# Patient Record
Sex: Female | Born: 1955 | Race: White | Hispanic: No | Marital: Married | Smoking: Never smoker
Health system: Southern US, Community
[De-identification: ages and names within clinical notes are randomized; demographics above are authoritative.]

## PROBLEM LIST (undated history)

## (undated) DIAGNOSIS — R112 Nausea with vomiting, unspecified: Secondary | ICD-10-CM

## (undated) DIAGNOSIS — I4892 Unspecified atrial flutter: Secondary | ICD-10-CM

## (undated) DIAGNOSIS — M1990 Unspecified osteoarthritis, unspecified site: Secondary | ICD-10-CM

## (undated) DIAGNOSIS — I499 Cardiac arrhythmia, unspecified: Secondary | ICD-10-CM

## (undated) DIAGNOSIS — F329 Major depressive disorder, single episode, unspecified: Secondary | ICD-10-CM

## (undated) DIAGNOSIS — D649 Anemia, unspecified: Secondary | ICD-10-CM

## (undated) DIAGNOSIS — K219 Gastro-esophageal reflux disease without esophagitis: Secondary | ICD-10-CM

## (undated) DIAGNOSIS — E119 Type 2 diabetes mellitus without complications: Secondary | ICD-10-CM

## (undated) DIAGNOSIS — F419 Anxiety disorder, unspecified: Secondary | ICD-10-CM

## (undated) DIAGNOSIS — E11610 Type 2 diabetes mellitus with diabetic neuropathic arthropathy: Secondary | ICD-10-CM

## (undated) DIAGNOSIS — Z9889 Other specified postprocedural states: Secondary | ICD-10-CM

## (undated) DIAGNOSIS — F32A Depression, unspecified: Secondary | ICD-10-CM

## (undated) DIAGNOSIS — I10 Essential (primary) hypertension: Secondary | ICD-10-CM

## (undated) DIAGNOSIS — R197 Diarrhea, unspecified: Secondary | ICD-10-CM

## (undated) DIAGNOSIS — G629 Polyneuropathy, unspecified: Secondary | ICD-10-CM

## (undated) HISTORY — PX: HAND SURGERY: SHX662

## (undated) HISTORY — DX: Unspecified atrial flutter: I48.92

## (undated) HISTORY — PX: FOOT SURGERY: SHX648

## (undated) HISTORY — PX: APPENDECTOMY: SHX54

## (undated) HISTORY — PX: HEMORRHOID SURGERY: SHX153

---

## 1998-02-06 ENCOUNTER — Inpatient Hospital Stay (HOSPITAL_COMMUNITY): Admission: EM | Admit: 1998-02-06 | Discharge: 1998-02-09 | Payer: Self-pay | Admitting: Emergency Medicine

## 1998-02-07 ENCOUNTER — Encounter: Payer: Self-pay | Admitting: Specialist

## 2010-11-25 DIAGNOSIS — I32 Pericarditis in diseases classified elsewhere: Secondary | ICD-10-CM

## 2012-11-01 ENCOUNTER — Encounter (HOSPITAL_COMMUNITY): Payer: Self-pay | Admitting: Emergency Medicine

## 2012-11-01 ENCOUNTER — Inpatient Hospital Stay (HOSPITAL_COMMUNITY): Payer: 59

## 2012-11-01 ENCOUNTER — Emergency Department (HOSPITAL_COMMUNITY): Payer: 59

## 2012-11-01 ENCOUNTER — Inpatient Hospital Stay (HOSPITAL_COMMUNITY)
Admission: EM | Admit: 2012-11-01 | Discharge: 2012-11-06 | DRG: 308 | Disposition: A | Discharge status: Home / Self Care | Payer: 59 | Attending: Internal Medicine | Admitting: Internal Medicine

## 2012-11-01 DIAGNOSIS — Z79899 Other long term (current) drug therapy: Secondary | ICD-10-CM

## 2012-11-01 DIAGNOSIS — E1149 Type 2 diabetes mellitus with other diabetic neurological complication: Secondary | ICD-10-CM | POA: Diagnosis present

## 2012-11-01 DIAGNOSIS — E876 Hypokalemia: Secondary | ICD-10-CM | POA: Diagnosis present

## 2012-11-01 DIAGNOSIS — R112 Nausea with vomiting, unspecified: Secondary | ICD-10-CM | POA: Diagnosis present

## 2012-11-01 DIAGNOSIS — R000 Tachycardia, unspecified: Secondary | ICD-10-CM | POA: Diagnosis present

## 2012-11-01 DIAGNOSIS — I4892 Unspecified atrial flutter: Principal | ICD-10-CM | POA: Diagnosis present

## 2012-11-01 DIAGNOSIS — R5381 Other malaise: Secondary | ICD-10-CM

## 2012-11-01 DIAGNOSIS — Z7901 Long term (current) use of anticoagulants: Secondary | ICD-10-CM

## 2012-11-01 DIAGNOSIS — E872 Acidosis, unspecified: Secondary | ICD-10-CM | POA: Diagnosis present

## 2012-11-01 DIAGNOSIS — E860 Dehydration: Secondary | ICD-10-CM

## 2012-11-01 DIAGNOSIS — N179 Acute kidney failure, unspecified: Secondary | ICD-10-CM

## 2012-11-01 DIAGNOSIS — I10 Essential (primary) hypertension: Secondary | ICD-10-CM | POA: Diagnosis present

## 2012-11-01 DIAGNOSIS — Z91041 Radiographic dye allergy status: Secondary | ICD-10-CM

## 2012-11-01 DIAGNOSIS — G894 Chronic pain syndrome: Secondary | ICD-10-CM | POA: Diagnosis present

## 2012-11-01 DIAGNOSIS — G8929 Other chronic pain: Secondary | ICD-10-CM | POA: Diagnosis present

## 2012-11-01 DIAGNOSIS — F19939 Other psychoactive substance use, unspecified with withdrawal, unspecified: Secondary | ICD-10-CM | POA: Diagnosis present

## 2012-11-01 DIAGNOSIS — X088XXA Exposure to other specified smoke, fire and flames, initial encounter: Secondary | ICD-10-CM | POA: Diagnosis present

## 2012-11-01 DIAGNOSIS — F1120 Opioid dependence, uncomplicated: Secondary | ICD-10-CM | POA: Diagnosis present

## 2012-11-01 DIAGNOSIS — A419 Sepsis, unspecified organism: Secondary | ICD-10-CM | POA: Diagnosis present

## 2012-11-01 DIAGNOSIS — N390 Urinary tract infection, site not specified: Secondary | ICD-10-CM

## 2012-11-01 DIAGNOSIS — M549 Dorsalgia, unspecified: Secondary | ICD-10-CM | POA: Diagnosis present

## 2012-11-01 DIAGNOSIS — K219 Gastro-esophageal reflux disease without esophagitis: Secondary | ICD-10-CM | POA: Diagnosis present

## 2012-11-01 DIAGNOSIS — E875 Hyperkalemia: Secondary | ICD-10-CM | POA: Diagnosis present

## 2012-11-01 DIAGNOSIS — T23029A Burn of unspecified degree of unspecified single finger (nail) except thumb, initial encounter: Secondary | ICD-10-CM | POA: Diagnosis present

## 2012-11-01 DIAGNOSIS — E1140 Type 2 diabetes mellitus with diabetic neuropathy, unspecified: Secondary | ICD-10-CM | POA: Diagnosis present

## 2012-11-01 HISTORY — DX: Cardiac arrhythmia, unspecified: I49.9

## 2012-11-01 HISTORY — DX: Polyneuropathy, unspecified: G62.9

## 2012-11-01 HISTORY — DX: Type 2 diabetes mellitus without complications: E11.9

## 2012-11-01 HISTORY — DX: Type 2 diabetes mellitus with diabetic neuropathic arthropathy: E11.610

## 2012-11-01 HISTORY — DX: Essential (primary) hypertension: I10

## 2012-11-01 HISTORY — DX: Unspecified osteoarthritis, unspecified site: M19.90

## 2012-11-01 HISTORY — DX: Other specified postprocedural states: Z98.890

## 2012-11-01 HISTORY — DX: Nausea with vomiting, unspecified: R11.2

## 2012-11-01 LAB — CBC WITH DIFFERENTIAL/PLATELET
Basophils Absolute: 0 10*3/uL (ref 0.0–0.1)
Basophils Relative: 0 % (ref 0–1)
Eosinophils Absolute: 0 10*3/uL (ref 0.0–0.7)
Eosinophils Relative: 0 % (ref 0–5)
HCT: 41.3 % (ref 36.0–46.0)
Hemoglobin: 13.4 g/dL (ref 12.0–15.0)
Lymphocytes Relative: 28 % (ref 12–46)
Lymphs Abs: 2.8 10*3/uL (ref 0.7–4.0)
MCH: 27.2 pg (ref 26.0–34.0)
MCHC: 32.4 g/dL (ref 30.0–36.0)
MCV: 83.8 fL (ref 78.0–100.0)
Monocytes Absolute: 0.9 10*3/uL (ref 0.1–1.0)
Monocytes Relative: 10 % (ref 3–12)
Neutro Abs: 6.1 10*3/uL (ref 1.7–7.7)
Neutrophils Relative %: 62 % (ref 43–77)
Platelets: 263 10*3/uL (ref 150–400)
RBC: 4.93 MIL/uL (ref 3.87–5.11)
RDW: 15.5 % (ref 11.5–15.5)
WBC: 9.9 10*3/uL (ref 4.0–10.5)

## 2012-11-01 LAB — COMPREHENSIVE METABOLIC PANEL
ALT: 15 U/L (ref 0–35)
AST: 22 U/L (ref 0–37)
Albumin: 4.2 g/dL (ref 3.5–5.2)
Alkaline Phosphatase: 77 U/L (ref 39–117)
BUN: 39 mg/dL — ABNORMAL HIGH (ref 6–23)
CO2: 26 mEq/L (ref 19–32)
Calcium: 8.4 mg/dL (ref 8.4–10.5)
Chloride: 89 mEq/L — ABNORMAL LOW (ref 96–112)
Creatinine, Ser: 4.81 mg/dL — ABNORMAL HIGH (ref 0.50–1.10)
GFR calc Af Amer: 11 mL/min — ABNORMAL LOW (ref >90)
GFR calc non Af Amer: 9 mL/min — ABNORMAL LOW (ref >90)
Glucose, Bld: 197 mg/dL — ABNORMAL HIGH (ref 70–99)
Potassium: 5.8 mEq/L — ABNORMAL HIGH (ref 3.5–5.1)
Sodium: 136 mEq/L (ref 135–145)
Total Bilirubin: 0.4 mg/dL (ref 0.3–1.2)
Total Protein: 7.7 g/dL (ref 6.0–8.3)

## 2012-11-01 LAB — RAPID URINE DRUG SCREEN, HOSP PERFORMED
Amphetamines: NOT DETECTED
Opiates: POSITIVE — AB

## 2012-11-01 LAB — BASIC METABOLIC PANEL
Chloride: 96 mEq/L (ref 96–112)
Creatinine, Ser: 3.42 mg/dL — ABNORMAL HIGH (ref 0.50–1.10)
GFR calc Af Amer: 16 mL/min — ABNORMAL LOW (ref >90)
Sodium: 137 mEq/L (ref 135–145)

## 2012-11-01 LAB — URINALYSIS, ROUTINE W REFLEX MICROSCOPIC
Hgb urine dipstick: NEGATIVE
Nitrite: NEGATIVE
Protein, ur: NEGATIVE mg/dL
Urobilinogen, UA: 0.2 mg/dL (ref 0.0–1.0)

## 2012-11-01 LAB — POCT I-STAT TROPONIN I

## 2012-11-01 LAB — CG4 I-STAT (LACTIC ACID): Lactic Acid, Venous: 4.75 mmol/L — ABNORMAL HIGH (ref 0.5–2.2)

## 2012-11-01 LAB — URINE MICROSCOPIC-ADD ON

## 2012-11-01 LAB — TROPONIN I: Troponin I: <0.3 ng/mL (ref <0.30)

## 2012-11-01 LAB — GLUCOSE, CAPILLARY: Glucose-Capillary: 82 mg/dL (ref 70–99)

## 2012-11-01 LAB — MRSA PCR SCREENING: MRSA by PCR: NEGATIVE

## 2012-11-01 LAB — LIPASE, BLOOD: Lipase: 17 U/L (ref 11–59)

## 2012-11-01 MED ORDER — LOPERAMIDE HCL 2 MG PO CAPS: 4.0000 mg | ORAL_CAPSULE | Freq: Once | ORAL | Status: DC

## 2012-11-01 MED ORDER — SODIUM CHLORIDE 0.9 % IV SOLN
INTRAVENOUS | Status: DC
  Administered 2012-11-01: 18:00:00 via INTRAVENOUS

## 2012-11-01 MED ORDER — HYDROCODONE-ACETAMINOPHEN 5-325 MG PO TABS
1.0000 | ORAL_TABLET | ORAL | Status: DC | PRN
  Administered 2012-11-02: 1 via ORAL
  Administered 2012-11-02: 2 via ORAL
  Administered 2012-11-02 – 2012-11-03 (×2): 1 via ORAL
  Administered 2012-11-03 – 2012-11-06 (×9): 2 via ORAL
  Filled 2012-11-01 (×6): qty 2
  Filled 2012-11-01: qty 1
  Filled 2012-11-01 (×2): qty 2
  Filled 2012-11-01 (×2): qty 1
  Filled 2012-11-01 (×2): qty 2

## 2012-11-01 MED ORDER — ONDANSETRON HCL 4 MG/2ML IJ SOLN: 4.0000 mg | Freq: Four times a day (QID) | INTRAMUSCULAR | Status: DC | PRN

## 2012-11-01 MED ORDER — SODIUM POLYSTYRENE SULFONATE 15 GM/60ML PO SUSP
15.0000 g | Freq: Once | ORAL | Status: AC
  Administered 2012-11-01: 15 g via ORAL
  Filled 2012-11-01: qty 60

## 2012-11-01 MED ORDER — GABAPENTIN 800 MG PO TABS: 400.0000 mg | ORAL_TABLET | Freq: Two times a day (BID) | ORAL | Status: DC

## 2012-11-01 MED ORDER — INSULIN ASPART 100 UNIT/ML ~~LOC~~ SOLN
0.0000 [IU] | Freq: Three times a day (TID) | SUBCUTANEOUS | Status: DC
  Administered 2012-11-02: 2 [IU] via SUBCUTANEOUS
  Administered 2012-11-02: 1 [IU] via SUBCUTANEOUS
  Administered 2012-11-02: 2 [IU] via SUBCUTANEOUS
  Administered 2012-11-03 – 2012-11-04 (×4): 3 [IU] via SUBCUTANEOUS
  Administered 2012-11-04 – 2012-11-06 (×4): 2 [IU] via SUBCUTANEOUS

## 2012-11-01 MED ORDER — NYSTATIN 100000 UNIT/GM EX POWD
1.0000 g | Freq: Three times a day (TID) | CUTANEOUS | Status: DC
  Administered 2012-11-01 – 2012-11-06 (×14): 1 g via TOPICAL
  Filled 2012-11-01 (×3): qty 15

## 2012-11-01 MED ORDER — CYCLOBENZAPRINE HCL 5 MG PO TABS
5.0000 mg | ORAL_TABLET | Freq: Three times a day (TID) | ORAL | Status: DC | PRN
  Filled 2012-11-01: qty 1

## 2012-11-01 MED ORDER — DEXTROSE-NACL 5-0.9 % IV SOLN
INTRAVENOUS | Status: DC
  Administered 2012-11-02: 125 mL/h via INTRAVENOUS

## 2012-11-01 MED ORDER — METOPROLOL TARTRATE 1 MG/ML IV SOLN
2.5000 mg | Freq: Four times a day (QID) | INTRAVENOUS | Status: DC | PRN
  Administered 2012-11-02: 2.5 mg via INTRAVENOUS
  Filled 2012-11-01 (×2): qty 5

## 2012-11-01 MED ORDER — GABAPENTIN 300 MG PO CAPS
600.0000 mg | ORAL_CAPSULE | Freq: Every morning | ORAL | Status: DC
  Administered 2012-11-01 – 2012-11-02 (×2): 600 mg via ORAL
  Filled 2012-11-01 (×3): qty 2

## 2012-11-01 MED ORDER — PANTOPRAZOLE SODIUM 40 MG IV SOLR
40.0000 mg | Freq: Two times a day (BID) | INTRAVENOUS | Status: DC
  Administered 2012-11-01 – 2012-11-02 (×4): 40 mg via INTRAVENOUS
  Filled 2012-11-01 (×6): qty 40

## 2012-11-01 MED ORDER — HEPARIN SODIUM (PORCINE) 5000 UNIT/ML IJ SOLN
5000.0000 [IU] | Freq: Three times a day (TID) | INTRAMUSCULAR | Status: DC
  Administered 2012-11-01 – 2012-11-02 (×2): 5000 [IU] via SUBCUTANEOUS
  Filled 2012-11-01 (×5): qty 1

## 2012-11-01 MED ORDER — SODIUM CHLORIDE 0.9 % IV BOLUS (SEPSIS)
500.0000 mL | Freq: Once | INTRAVENOUS | Status: AC
  Administered 2012-11-01: 500 mL via INTRAVENOUS

## 2012-11-01 MED ORDER — MORPHINE SULFATE 2 MG/ML IJ SOLN: 1.0000 mg | INTRAMUSCULAR | Status: DC | PRN

## 2012-11-01 MED ORDER — SODIUM CHLORIDE 0.9 % IV SOLN
INTRAVENOUS | Status: DC
  Administered 2012-11-01: 16:00:00 via INTRAVENOUS

## 2012-11-01 MED ORDER — DEXTROSE-NACL 5-0.9 % IV SOLN
INTRAVENOUS | Status: AC
  Administered 2012-11-01: 150 mL via INTRAVENOUS

## 2012-11-01 MED ORDER — PIPERACILLIN-TAZOBACTAM 3.375 G IVPB 30 MIN
3.3750 g | Freq: Once | INTRAVENOUS | Status: AC
  Administered 2012-11-01: 3.375 g via INTRAVENOUS
  Filled 2012-11-01: qty 50

## 2012-11-01 MED ORDER — ACETAMINOPHEN 325 MG PO TABS: 650.0000 mg | ORAL_TABLET | Freq: Four times a day (QID) | ORAL | Status: DC | PRN

## 2012-11-01 MED ORDER — ONDANSETRON HCL 4 MG PO TABS: 4.0000 mg | ORAL_TABLET | Freq: Four times a day (QID) | ORAL | Status: DC | PRN

## 2012-11-01 MED ORDER — ACETAMINOPHEN 650 MG RE SUPP: 650.0000 mg | Freq: Four times a day (QID) | RECTAL | Status: DC | PRN

## 2012-11-01 MED ORDER — AMITRIPTYLINE HCL 25 MG PO TABS
25.0000 mg | ORAL_TABLET | Freq: Every day | ORAL | Status: DC
  Administered 2012-11-01 – 2012-11-05 (×5): 25 mg via ORAL
  Filled 2012-11-01 (×7): qty 1

## 2012-11-01 MED ORDER — ONDANSETRON HCL 4 MG/2ML IJ SOLN
4.0000 mg | Freq: Once | INTRAMUSCULAR | Status: AC
  Administered 2012-11-01: 4 mg via INTRAVENOUS
  Filled 2012-11-01: qty 2

## 2012-11-01 MED ORDER — CALCIUM GLUCONATE 10 % IV SOLN: 1.0000 g | Freq: Once | INTRAVENOUS | Status: DC

## 2012-11-01 MED ORDER — SODIUM CHLORIDE 0.9 % IV SOLN
1.0000 g | Freq: Once | INTRAVENOUS | Status: AC
  Administered 2012-11-01: 1 g via INTRAVENOUS
  Filled 2012-11-01: qty 10

## 2012-11-01 MED ORDER — MORPHINE SULFATE 2 MG/ML IJ SOLN
1.0000 mg | INTRAMUSCULAR | Status: DC | PRN
  Administered 2012-11-02: 2 mg via INTRAVENOUS
  Filled 2012-11-01: qty 1

## 2012-11-01 MED ORDER — PIPERACILLIN-TAZOBACTAM IN DEX 2-0.25 GM/50ML IV SOLN
2.2500 g | Freq: Three times a day (TID) | INTRAVENOUS | Status: DC
  Administered 2012-11-01 – 2012-11-02 (×2): 2.25 g via INTRAVENOUS
  Filled 2012-11-01 (×4): qty 50

## 2012-11-01 MED ORDER — SODIUM BICARBONATE 8.4 % IV SOLN
50.0000 meq | Freq: Once | INTRAVENOUS | Status: AC
  Administered 2012-11-01: 50 meq via INTRAVENOUS
  Filled 2012-11-01: qty 50

## 2012-11-01 MED ORDER — SODIUM CHLORIDE 0.9 % IV BOLUS (SEPSIS)
2000.0000 mL | Freq: Once | INTRAVENOUS | Status: AC
  Administered 2012-11-01: 2000 mL via INTRAVENOUS

## 2012-11-01 NOTE — ED Notes (Signed)
Unable to give report at this time, charge reports she had to call a nurse to come in.

## 2012-11-01 NOTE — ED Notes (Signed)
Patient transported to CT 

## 2012-11-01 NOTE — ED Notes (Signed)
Dr Silverio Lay notified of abnormal lactic.

## 2012-11-01 NOTE — ED Notes (Signed)
US in progress

## 2012-11-01 NOTE — Progress Notes (Signed)
ANTIBIOTIC CONSULT NOTE - INITIAL  Pharmacy Consult for Zosyn Indication:   Allergies  Allergen Reactions  . Contrast Media (Iodinated Diagnostic Agents) Anaphylaxis    IVP-DYE (PATIENT STATES THAT SHE DIED ON THE OPERATING ROOM TABLE)  . Erythromycin Other (See Comments)    SEVERE YEAST INFECTION  . Tegretol (Carbamazepine) Other (See Comments)    Increased heart rate    Patient Measurements:   Adjusted Body Weight:   Vital Signs: Temp: 97.9 F (36.6 C) (06/27 1138) Temp src: Oral (06/27 1138) BP: 125/92 mmHg (06/27 1449) Pulse Rate: 146 (06/27 1138) Intake/Output from previous day:   Intake/Output from this shift:    Labs:  Recent Labs  11/01/12 1150  WBC 9.9  HGB 13.4  PLT 263  CREATININE 4.81*   CrCl is unknown because there is no height on file for the current visit. No results found for this basename: VANCOTROUGH, VANCOPEAK, VANCORANDOM, GENTTROUGH, GENTPEAK, GENTRANDOM, TOBRATROUGH, TOBRAPEAK, TOBRARND, AMIKACINPEAK, AMIKACINTROU, AMIKACIN,  in the last 72 hours   Microbiology: No results found for this or any previous visit (from the past 720 hour(s)).  Medical History: Past Medical History  Diagnosis Date  . Diabetes mellitus without complication   . Arthritis   . Neuropathy   . Charcot foot due to diabetes mellitus   . PONV (postoperative nausea and vomiting)     Assessment: 64 yoF admitted for opiate withdrawal and vomiting.  PMHx significant for DM, charcot foot, and arthritis.  Pt found to be in ARF, with elevated lactate and potassium.  WBC 9.9.  Afebrile.  Pt received one dose of zosyn, and now pharmacy consulted to dose.  Blood, urine cultures collected.  SCr 4.81, no height or weight available.  Normalized CrCl =14 ml/min.   Plan:  1.  Zosyn 2.25gm IV q 8 hours 2.  F/u renal function, cultures, Tm, WBC, and adjust zosyn when CrCl > 20 ml/min.    Haynes Hoehn, PharmD 11/01/2012 3:52 PM  Pager: [phone]

## 2012-11-01 NOTE — H&P (Signed)
[facility] History and Physical  Rebecca Brennan UJW:[id number] DOB: February 11, 1956 DOA: 11/01/2012  Referring physician: Dr Silverio Lay.  PCP: No primary provider on file.  Specialists: None  Chief Complaint: Nausea, vomiting. Wanted to be detox.   HPI: Rebecca Brennan is a 57 y.o. female with PMH significant for opioid dependent, Diabetes, charcot foot who presents to ED complaining of nausea and vomiting since Wednesday. She has not been able to keep her medications down since that time. [facility] wanted to be detox from opioid, but they decline her and refer her to ED. She denies abdominal pain, chest pain, dyspnea, dysuria. She had some fever 3 days prior to admission. She saw  her PCP 3 days ago because of nausea, she received a dose of phenergan. She is having diarrhea since she took the oral contrast here in the ed. She wanted to be detox from opioid because she is tired to be sleepy, and not functioning.   Review of Systems: negative except as per HPI.   Past Medical History  Diagnosis Date  . Diabetes mellitus without complication   . Arthritis   . Neuropathy   . Charcot foot due to diabetes mellitus   . PONV (postoperative nausea and vomiting)    Past Surgical History  Procedure Laterality Date  . Cesarean section    . Appendectomy    . Foot surgery     Social History:  reports that she has never smoked. She has never used smokeless tobacco. She reports that she does not drink alcohol or use illicit drugs.live at home with husband,has one son. She is retired. She was a Lawyer.    Allergies  Allergen Reactions  . Contrast Media (Iodinated Diagnostic Agents) Anaphylaxis    IVP-DYE (PATIENT STATES THAT SHE DIED ON THE OPERATING ROOM TABLE)  . Erythromycin Other (See Comments)    SEVERE YEAST INFECTION  . Tegretol (Carbamazepine) Other (See Comments)    Increased heart rate    Family History  Problem Relation Age of Onset   . Arthritis Mother   . Diabetes Father     Prior to Admission medications   Medication Sig Start Date End Date Taking? Authorizing Provider  amitriptyline (ELAVIL) 25 MG tablet Take 25 mg by mouth at bedtime.   Yes Historical Provider, MD  cyclobenzaprine (FLEXERIL) 10 MG tablet Take 10 mg by mouth 3 (three) times daily as needed for muscle spasms.   Yes Historical Provider, MD  estradiol (ESTRING) 2 MG vaginal ring Place 2 mg vaginally every 3 (three) months. follow package directions   Yes Historical Provider, MD  gabapentin (NEURONTIN) 800 MG tablet Take 800 mg by mouth 5 (five) times daily.   Yes Historical Provider, MD  HYDROcodone-acetaminophen (NORCO) 7.5-325 MG per tablet Take 2 tablets by mouth every 6 (six) hours as needed for pain.   Yes Historical Provider, MD  lisinopril (PRINIVIL,ZESTRIL) 10 MG tablet Take 10 mg by mouth daily.   Yes Historical Provider, MD  metFORMIN (GLUCOPHAGE) 500 MG tablet Take 500 mg by mouth 5 (five) times daily.   Yes Historical Provider, MD  metFORMIN (GLUCOPHAGE-XR) 500 MG 24 hr tablet Take 2,500 mg by mouth daily with breakfast.   Yes Historical Provider, MD  morphine (MS CONTIN) 60 MG 12 hr tablet Take 60 mg by mouth 2 (two) times daily.   Yes Historical Provider, MD  nystatin (MYCOSTATIN/NYSTOP) 100000 UNIT/GM POWD Apply 1 g topically 3 (three) times daily.  Yes Historical Provider, MD  omeprazole (PRILOSEC) 20 MG capsule Take 20 mg by mouth every morning.   Yes Historical Provider, MD  ondansetron (ZOFRAN-ODT) 8 MG disintegrating tablet Take 8 mg by mouth every 8 (eight) hours as needed for nausea.   Yes Historical Provider, MD   Physical Exam: Filed Vitals:   11/01/12 1134 11/01/12 1138 11/01/12 1449  BP: 90/60  125/92  Pulse: 146 146   Temp: 97.9 F (36.6 C) 97.9 F (36.6 C)   TempSrc: Oral Oral   Resp: 20  15  SpO2: 100%  100%   General Appearance:    Alert, cooperative, no distress, appears stated age  Head:    Normocephalic, without  obvious abnormality, atraumatic  Eyes:    PERRL, conjunctiva/corneas clear, EOM's intact,     Ears:    Normal TM's and external ear canals, both ears  Nose:   Nares normal, septum midline, mucosa normal, no drainage    or sinus tenderness  Throat:   Lips, mucosa, and tongue normal; teeth and gums normal  Neck:   Supple, symmetrical, trachea midline, no adenopathy;    thyroid:  no enlargement/tenderness/nodules; no carotid   bruit or JVD  Back:     Symmetric, no curvature, ROM normal, no CVA tenderness  Lungs:     Clear to auscultation bilaterally, respirations unlabored  Chest Wall:    No tenderness or deformity   Heart:    Regular rate and rhythm, S1 and S2 normal, no murmur, rub   or gallop     Abdomen:     Soft, non-tender, bowel sounds active all four quadrants,    no masses, no organomegaly        Extremities:   Extremities normal, atraumatic, no cyanosis or edema  Pulses:   2+ and symmetric all extremities  Skin:   Skin color, texture, turgor normal, no rashes or lesions  Lymph nodes:   Cervical, supraclavicular, and axillary nodes normal  Neurologic:   CNII-XII intact, normal strength, sensation and reflexes    throughout      Labs on Admission:  Basic Metabolic Panel:  Recent Labs Lab 11/01/12 1150  NA 136  K 5.8*  CL 89*  CO2 26  GLUCOSE 197*  BUN 39*  CREATININE 4.81*  CALCIUM 8.4   Liver Function Tests:  Recent Labs Lab 11/01/12 1150  AST 22  ALT 15  ALKPHOS 77  BILITOT 0.4  PROT 7.7  ALBUMIN 4.2    Recent Labs Lab 11/01/12 1150  LIPASE 17   No results found for this basename: AMMONIA,  in the last 168 hours CBC:  Recent Labs Lab 11/01/12 1150  WBC 9.9  NEUTROABS 6.1  HGB 13.4  HCT 41.3  MCV 83.8  PLT 263   Cardiac Enzymes: No results found for this basename: CKTOTAL, CKMB, CKMBINDEX, TROPONINI,  in the last 168 hours  BNP (last 3 results) No results found for this basename: PROBNP,  in the last 8760 hours CBG: No results  found for this basename: GLUCAP,  in the last 168 hours  Radiological Exams on Admission: Ct Abdomen Pelvis Wo Contrast  11/01/2012   *RADIOLOGY REPORT*  Clinical Data: Abdominal pain.  Nausea and vomiting.  Surgical history includes appendectomy, cesarean section, and pessary. Renal insufficiency and prior anaphylactic reaction to iodinated contrast precluded IV contrast administration.  CT ABDOMEN AND PELVIS WITHOUT CONTRAST  Technique:  Multidetector CT imaging of the abdomen and pelvis was performed following the standard protocol without intravenous contrast.  Oral contrast was administered.  Comparison: Report of CT abdomen and pelvis 11/25/2010 from [facility], describing pericardial fluid but no acute findings in the abdomen or pelvis.  The images themselves are not currently available for direct comparison.  Findings: Calcified granulomata in the liver which is otherwise unremarkable for the unenhanced technique.  Normal unenhanced appearance of the spleen and adrenal glands.  Lobular contour to both kidneys consistent with fetal lobulations.  Very small (2 mm) calculus in a lower pole calix of the left kidney without obstruction.  No urinary tract calculi elsewhere on either side. Severe diffuse pancreatic atrophy.  Focal wall thickening involving the independent portion of the gallbladder fundus.  No calcified gallstones.  No biliary ductal dilation.  Mild to moderate aorto- iliofemoral atherosclerosis.  No significant lymphadenopathy.  Oral contrast material within the distal esophagus.  No evidence of hiatal hernia.  Stomach normal in appearance.  Normal-appearing small bowel.  Apparent wall thickening involving the descending colon, sigmoid colon, and rectum is felt to be due to the fact that these portions of the colon are decompressed, as there is no pericolonic inflammation.  Appendix surgically absent.  No ascites.  Pessary device in the pelvis.  Urinary bladder decompressed.  Uterus mildly atrophic consistent with age.  Ovaries normal by CT. No free pelvic fluid.  Numerous pelvic phleboliths.  Bone window images demonstrate severe degenerative changes involving the lower thoracic and lumbar spine.  Visualized lung bases clear.  Heart size normal.  No evidence of pericardial effusion, though there may be mild pericardial thickening.  IMPRESSION:  1.  No acute abnormalities involving the abdomen or pelvis. 2.  Severe pancreatic atrophy. 3.  Focal thickening involving the wall of the gallbladder fundus. Non-emergent right upper quadrant abdominal ultrasound is suggested to exclude a gallbladder tumor. 4.  Non-obstructing 2 mm left lower pole renal calculus.  No urinary tract calculi elsewhere on either side. 5.  Oral contrast material in the distal esophagus without evidence of hiatal hernia.  Query GE reflux and/or esophageal dysmotility. 6.  Mild pericardial thickening without evidence of pericardial effusion.   Original Report Authenticated By: Hulan Saas, M.D.   Dg Chest Portable 1 View  11/01/2012   *RADIOLOGY REPORT*  Clinical Data: Diabetes, nonsmoker  PORTABLE CHEST - 1 VIEW  Comparison: Portable exam 1222 hours without priors for comparison  Findings: Upper normal heart size. Mediastinal contours and pulmonary vascularity normal. Minimal bronchitic changes with atelectasis or scarring lingula. Lungs otherwise clear. No pleural effusion or pneumothorax. Bones unremarkable.  IMPRESSION: Minimal bronchitic changes with lingular atelectasis versus scarring. No additional abnormalities.   Original Report Authenticated By: Ulyses Southward, M.D.    EKG: Independently reviewed. Diffuse st segment elevation.   Assessment/Plan Active Problems:   Acute renal failure   Lactic acidosis   Tachycardia   Nausea & vomiting  1-Acute Renal failure; Probably pre renal, in setting dehydration, ACE, probably UTI. Continue with IV fluids. Will order urine na and cr. Abdominal US to  evaluate for renal obstruction.   2-Lactic acidosis/Sepsis: This could be secondary to UTI, but also in setting of renal failure and metformin intake. IV fluids, stop metformin, blood culture. CCM will monitor patient. BP improved after IV fluids. Zosyn pre pharmacy to cover for UTI and intraabdominal process.  3-Chronic pain syndrome/ Drug withdrawal: Her symptoms of nausea and vomiting started prior to stopping taking medication. She probably has opioids in her system due to decrease renal clearance. Will continue with morphine PRN, monitor for  drug withdrawl. I will adjust gabapentin for renal function.   4-Nausea/vomiting: This could be secondary to infection, maybe component of withdrawal. CT abdomen show thickening of the wall of the gallbladder fundus. Will order abdominal US. IV protonix. Patient denies abdominal pain.  5-EKG abnormalities: Diffuse ST elevation, subsequently EKG A flutter. In setting of electrolytes abnormalities, Hypotension. Cycle cardiac enzymes, Repeat EKG in am, ECHO. PRN lopressor but careful with hypotension. Patient denies chest pain.  6-Hyperkalemia: Received Kayexalate, and calcium gluconate. Repeat B-met later tonight.  7-UTI: UA with small leukocytes. Zosyn should cover. Follow urine culture.  8-Focal thickening involving the wall of the gallbladder fundus. Abdominal US ordered.  9-Diabetes: discontinue metformin. SSI     Code Status: presume full code.  Family Communication: Care discussed with husband and son who were at bedside.  Disposition Plan: expect 3 to 4 days inpatient.   Time spent: 75 minutes.   REGALADO,BELKYS [facility] Pager [phone]  If 7PM-7AM, please contact night-coverage [email or web address] Password [facility] 11/01/2012, 3:49 PM

## 2012-11-01 NOTE — ED Provider Notes (Signed)
History    CSN: [id number] Arrival date & time 11/01/12  1124  First MD Initiated Contact with Patient 11/01/12 1133     Chief Complaint  Patient presents with  . Detox    (Consider location/radiation/quality/duration/timing/severity/associated sxs/prior Treatment) The history is provided by the patient.  Rebecca Brennan is a 57 y.o. female hx of DM with peripheral neuropathy, charcot foot, arthritis here with opiate withdrawal and vomiting. She had fever 4 days ago and saw her doctor 3 days ago. She had normal blood work and was given zofran for nausea. However, she had persistent vomiting and threw up all her medications. She has difficulty tolerating water. She has history of appendectomy and C section but no history of SBO in the past. Denies fevers now. She also wants detox from opiates.   Past Medical History  Diagnosis Date  . Diabetes mellitus without complication   . Arthritis   . Neuropathy   . Charcot foot due to diabetes mellitus    Past Surgical History  Procedure Laterality Date  . Cesarean section    . Appendectomy    . Foot surgery     History reviewed. No pertinent family history. History  Substance Use Topics  . Smoking status: Never Smoker   . Smokeless tobacco: Not on file  . Alcohol Use: No   OB History   Grav Para Term Preterm Abortions TAB SAB Ect Mult Living                 Review of Systems  Constitutional: Positive for fever and chills.  Gastrointestinal: Positive for vomiting and abdominal pain.  Neurological: Positive for weakness.  All other systems reviewed and are negative.    Allergies  Contrast media; Erythromycin; and Tegretol  Home Medications   Current Outpatient Rx  Name  Route  Sig  Dispense  Refill  . amitriptyline (ELAVIL) 25 MG tablet   Oral   Take 25 mg by mouth at bedtime.         . cyclobenzaprine (FLEXERIL) 10 MG tablet   Oral   Take 10 mg by mouth 3 (three) times daily as needed for muscle spasms.          Marland Kitchen estradiol (ESTRING) 2 MG vaginal ring   Vaginal   Place 2 mg vaginally every 3 (three) months. follow package directions         . gabapentin (NEURONTIN) 800 MG tablet   Oral   Take 800 mg by mouth 5 (five) times daily.         Marland Kitchen HYDROcodone-acetaminophen (NORCO) 7.5-325 MG per tablet   Oral   Take 2 tablets by mouth every 6 (six) hours as needed for pain.         Marland Kitchen lisinopril (PRINIVIL,ZESTRIL) 10 MG tablet   Oral   Take 10 mg by mouth daily.         . metFORMIN (GLUCOPHAGE) 500 MG tablet   Oral   Take 500 mg by mouth 5 (five) times daily.         . metFORMIN (GLUCOPHAGE-XR) 500 MG 24 hr tablet   Oral   Take 2,500 mg by mouth daily with breakfast.         . morphine (MS CONTIN) 60 MG 12 hr tablet   Oral   Take 60 mg by mouth 2 (two) times daily.         Marland Kitchen nystatin (MYCOSTATIN/NYSTOP) 100000 UNIT/GM POWD   Topical   Apply 1 g  topically 3 (three) times daily.         Marland Kitchen omeprazole (PRILOSEC) 20 MG capsule   Oral   Take 20 mg by mouth every morning.         . ondansetron (ZOFRAN-ODT) 8 MG disintegrating tablet   Oral   Take 8 mg by mouth every 8 (eight) hours as needed for nausea.          BP 90/60  Pulse 146  Temp(Src) 97.9 F (36.6 C) (Oral)  Resp 20  SpO2 100% Physical Exam  Nursing note and vitals reviewed. Constitutional: She is oriented to person, place, and time.  Uncomfortable, dehydrated.   HENT:  Head: Normocephalic.  MM dry   Eyes: Conjunctivae are normal. Pupils are equal, round, and reactive to light.  Neck: Normal range of motion. Neck supple.  Cardiovascular: Regular rhythm and normal heart sounds.   Tachycardic   Pulmonary/Chest: Effort normal and breath sounds normal. No respiratory distress. She has no wheezes. She has no rales.  Abdominal: Soft. Bowel sounds are normal.  Obese, minimal epigastric tenderness   Musculoskeletal: Normal range of motion.  Neurological: She is alert and oriented to person, place, and  time.  Skin: Skin is warm and dry.  Psychiatric: She has a normal mood and affect. Her behavior is normal. Judgment and thought content normal.    ED Course  Procedures (including critical care time) CRITICAL CARE Performed by: Silverio Lay, DAVID   Total critical care time: 30 min   Critical care time was exclusive of separately billable procedures and treating other patients.  Critical care was necessary to treat or prevent imminent or life-threatening deterioration.  Critical care was time spent personally by me on the following activities: development of treatment plan with patient and/or surrogate as well as nursing, discussions with consultants, evaluation of patient's response to treatment, examination of patient, obtaining history from patient or surrogate, ordering and performing treatments and interventions, ordering and review of laboratory studies, ordering and review of radiographic studies, pulse oximetry and re-evaluation of patient's condition.   Labs Reviewed  COMPREHENSIVE METABOLIC PANEL - Abnormal; Notable for the following:    Potassium 5.8 (*)    Chloride 89 (*)    Glucose, Bld 197 (*)    BUN 39 (*)    Creatinine, Ser 4.81 (*)    GFR calc non Af Amer 9 (*)    GFR calc Af Amer 11 (*)    All other components within normal limits  URINALYSIS, ROUTINE W REFLEX MICROSCOPIC - Abnormal; Notable for the following:    APPearance CLOUDY (*)    Bilirubin Urine MODERATE (*)    Leukocytes, UA SMALL (*)    All other components within normal limits  URINE RAPID DRUG SCREEN (HOSP PERFORMED) - Abnormal; Notable for the following:    Opiates POSITIVE (*)    All other components within normal limits  URINE MICROSCOPIC-ADD ON - Abnormal; Notable for the following:    Squamous Epithelial / LPF MANY (*)    Bacteria, UA FEW (*)    All other components within normal limits  CG4 I-STAT (LACTIC ACID) - Abnormal; Notable for the following:    Lactic Acid, Venous 4.75 (*)    All other  components within normal limits  URINE CULTURE  CBC WITH DIFFERENTIAL  LIPASE, BLOOD  POCT I-STAT TROPONIN I   Ct Abdomen Pelvis Wo Contrast  11/01/2012   *RADIOLOGY REPORT*  Clinical Data: Abdominal pain.  Nausea and vomiting.  Surgical history includes appendectomy,  cesarean section, and pessary. Renal insufficiency and prior anaphylactic reaction to iodinated contrast precluded IV contrast administration.  CT ABDOMEN AND PELVIS WITHOUT CONTRAST  Technique:  Multidetector CT imaging of the abdomen and pelvis was performed following the standard protocol without intravenous contrast.  Oral contrast was administered.  Comparison: Report of CT abdomen and pelvis 11/25/2010 from [facility], describing pericardial fluid but no acute findings in the abdomen or pelvis.  The images themselves are not currently available for direct comparison.  Findings: Calcified granulomata in the liver which is otherwise unremarkable for the unenhanced technique.  Normal unenhanced appearance of the spleen and adrenal glands.  Lobular contour to both kidneys consistent with fetal lobulations.  Very small (2 mm) calculus in a lower pole calix of the left kidney without obstruction.  No urinary tract calculi elsewhere on either side. Severe diffuse pancreatic atrophy.  Focal wall thickening involving the independent portion of the gallbladder fundus.  No calcified gallstones.  No biliary ductal dilation.  Mild to moderate aorto- iliofemoral atherosclerosis.  No significant lymphadenopathy.  Oral contrast material within the distal esophagus.  No evidence of hiatal hernia.  Stomach normal in appearance.  Normal-appearing small bowel.  Apparent wall thickening involving the descending colon, sigmoid colon, and rectum is felt to be due to the fact that these portions of the colon are decompressed, as there is no pericolonic inflammation.  Appendix surgically absent.  No ascites.  Pessary device in the pelvis.  Urinary  bladder decompressed. Uterus mildly atrophic consistent with age.  Ovaries normal by CT. No free pelvic fluid.  Numerous pelvic phleboliths.  Bone window images demonstrate severe degenerative changes involving the lower thoracic and lumbar spine.  Visualized lung bases clear.  Heart size normal.  No evidence of pericardial effusion, though there may be mild pericardial thickening.  IMPRESSION:  1.  No acute abnormalities involving the abdomen or pelvis. 2.  Severe pancreatic atrophy. 3.  Focal thickening involving the wall of the gallbladder fundus. Non-emergent right upper quadrant abdominal ultrasound is suggested to exclude a gallbladder tumor. 4.  Non-obstructing 2 mm left lower pole renal calculus.  No urinary tract calculi elsewhere on either side. 5.  Oral contrast material in the distal esophagus without evidence of hiatal hernia.  Query GE reflux and/or esophageal dysmotility. 6.  Mild pericardial thickening without evidence of pericardial effusion.   Original Report Authenticated By: Hulan Saas, M.D.   Dg Chest Portable 1 View  11/01/2012   *RADIOLOGY REPORT*  Clinical Data: Diabetes, nonsmoker  PORTABLE CHEST - 1 VIEW  Comparison: Portable exam 1222 hours without priors for comparison  Findings: Upper normal heart size. Mediastinal contours and pulmonary vascularity normal. Minimal bronchitic changes with atelectasis or scarring lingula. Lungs otherwise clear. No pleural effusion or pneumothorax. Bones unremarkable.  IMPRESSION: Minimal bronchitic changes with lingular atelectasis versus scarring. No additional abnormalities.   Original Report Authenticated By: Ulyses Southward, M.D.   No diagnosis found.   Date: 11/01/2012  Rate: 144  Rhythm: sinus tachycardia  QRS Axis: normal  Intervals: normal  ST/T Wave abnormalities: nonspecific ST changes  Conduction Disutrbances:none  Narrative Interpretation:   Old EKG Reviewed: none available    MDM  Rebecca Brennan is a 57 y.o. female here with  vomiting and dehydration. She is tachycardic and hypotensive. Will do sepsis workup but likely she is dehydrated. Will check labs and ct ab/pel to r/o SBO. Will hydrate and reassess.   2:47 PM Labs showed Cr. 4.8, K 5.8. CT  showed no SBO. I think she likely has acute renal failure from DM or dehydration causing hyperkalemia. Hyperkalemia is treated in the ED. BP improved with 2 L bolus. Still tachycardic. Will admit for monitoring and further workup.   2:47 PM Lactate elevated, BP improved. UA + UTI given ceftriaxone. I called critical care, Dr. Delford Field, who said stepdown admission is appropriate. I called Dr. Carmell Austria who accepted the patient.      Richardean Canal, MD 11/01/12 1455

## 2012-11-01 NOTE — ED Notes (Signed)
Letting fluids run 30-40 min.  Can hit veins, but blood very slow to come out.  RN aware and ok with that.

## 2012-11-01 NOTE — Progress Notes (Signed)
Utilization Review completed.  Breckyn Ticas RN CM  

## 2012-11-01 NOTE — ED Notes (Signed)
Pt sent from [facility] for Opiate Withdrawal. Pt reports pain in feet and back and has been taking opiates. Pt reports fever and chills on Tuesday and nausea and vomiting. Pt says she was not able to take her pain meds due to nausea and vomiting. Reports difficulty keeping down food or liquid. Pt able to give a full report and alert and oriented.

## 2012-11-02 ENCOUNTER — Encounter (HOSPITAL_COMMUNITY): Payer: Self-pay | Admitting: Cardiology

## 2012-11-02 ENCOUNTER — Inpatient Hospital Stay (HOSPITAL_COMMUNITY): Payer: 59

## 2012-11-02 DIAGNOSIS — R112 Nausea with vomiting, unspecified: Secondary | ICD-10-CM

## 2012-11-02 DIAGNOSIS — E875 Hyperkalemia: Secondary | ICD-10-CM

## 2012-11-02 DIAGNOSIS — I517 Cardiomegaly: Secondary | ICD-10-CM

## 2012-11-02 DIAGNOSIS — N179 Acute kidney failure, unspecified: Secondary | ICD-10-CM

## 2012-11-02 DIAGNOSIS — I4892 Unspecified atrial flutter: Principal | ICD-10-CM | POA: Diagnosis present

## 2012-11-02 DIAGNOSIS — E872 Acidosis: Secondary | ICD-10-CM

## 2012-11-02 DIAGNOSIS — A419 Sepsis, unspecified organism: Secondary | ICD-10-CM

## 2012-11-02 DIAGNOSIS — N390 Urinary tract infection, site not specified: Secondary | ICD-10-CM

## 2012-11-02 LAB — GLUCOSE, CAPILLARY
Glucose-Capillary: 115 mg/dL — ABNORMAL HIGH (ref 70–99)
Glucose-Capillary: 127 mg/dL — ABNORMAL HIGH (ref 70–99)

## 2012-11-02 LAB — BASIC METABOLIC PANEL
Chloride: 98 mEq/L (ref 96–112)
Creatinine, Ser: 2.33 mg/dL — ABNORMAL HIGH (ref 0.50–1.10)
GFR calc Af Amer: 25 mL/min — ABNORMAL LOW (ref >90)

## 2012-11-02 LAB — CBC
HCT: 37.5 % (ref 36.0–46.0)
Platelets: 167 10*3/uL (ref 150–400)
RDW: 15.7 % — ABNORMAL HIGH (ref 11.5–15.5)
WBC: 6.6 10*3/uL (ref 4.0–10.5)

## 2012-11-02 LAB — URINE CULTURE: Culture: NO GROWTH

## 2012-11-02 LAB — PROTIME-INR: Prothrombin Time: 15.6 seconds — ABNORMAL HIGH (ref 11.6–15.2)

## 2012-11-02 LAB — CREATININE, URINE, RANDOM: Creatinine, Urine: 183.2 mg/dL

## 2012-11-02 LAB — SODIUM, URINE, RANDOM: Sodium, Ur: 116 mEq/L

## 2012-11-02 MED ORDER — DOXYCYCLINE HYCLATE 100 MG PO TABS
100.0000 mg | ORAL_TABLET | Freq: Two times a day (BID) | ORAL | Status: DC
  Administered 2012-11-02 – 2012-11-06 (×9): 100 mg via ORAL
  Filled 2012-11-02 (×10): qty 1

## 2012-11-02 MED ORDER — PIPERACILLIN-TAZOBACTAM 3.375 G IVPB
3.3750 g | Freq: Three times a day (TID) | INTRAVENOUS | Status: DC
Start: 2012-11-02 — End: 2012-11-03
  Administered 2012-11-02 – 2012-11-03 (×3): 3.375 g via INTRAVENOUS
  Filled 2012-11-02 (×4): qty 50

## 2012-11-02 MED ORDER — SODIUM CHLORIDE 0.9 % IV SOLN
INTRAVENOUS | Status: DC
  Administered 2012-11-02 (×2): 125 mL/h via INTRAVENOUS
  Administered 2012-11-03 – 2012-11-04 (×3): via INTRAVENOUS
  Administered 2012-11-04: 75 mL via INTRAVENOUS
  Administered 2012-11-05: 17:00:00 via INTRAVENOUS
  Administered 2012-11-05: 75 mL via INTRAVENOUS

## 2012-11-02 MED ORDER — HEPARIN (PORCINE) IN NACL 100-0.45 UNIT/ML-% IJ SOLN
1100.0000 [IU]/h | INTRAMUSCULAR | Status: DC
  Administered 2012-11-02: 1100 [IU]/h via INTRAVENOUS
  Filled 2012-11-02: qty 250

## 2012-11-02 MED ORDER — HEPARIN BOLUS VIA INFUSION
4000.0000 [IU] | Freq: Once | INTRAVENOUS | Status: AC
  Administered 2012-11-02: 4000 [IU] via INTRAVENOUS
  Filled 2012-11-02: qty 4000

## 2012-11-02 MED ORDER — POTASSIUM CHLORIDE CRYS ER 20 MEQ PO TBCR
20.0000 meq | EXTENDED_RELEASE_TABLET | Freq: Once | ORAL | Status: AC
  Administered 2012-11-02: 20 meq via ORAL
  Filled 2012-11-02: qty 1

## 2012-11-02 MED ORDER — METOPROLOL TARTRATE 12.5 MG HALF TABLET
12.5000 mg | ORAL_TABLET | Freq: Four times a day (QID) | ORAL | Status: DC
  Administered 2012-11-02 – 2012-11-03 (×4): 12.5 mg via ORAL
  Filled 2012-11-02 (×8): qty 1
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
## 2012-11-02 NOTE — Progress Notes (Signed)
ANTIBIOTIC CONSULT NOTE - INITIAL  Pharmacy Consult for Zosyn Indication: Sepsis  Allergies  Allergen Reactions  . Contrast Media (Iodinated Diagnostic Agents) Anaphylaxis    IVP-DYE (PATIENT STATES THAT SHE DIED ON THE OPERATING ROOM TABLE)  . Erythromycin Other (See Comments)    SEVERE YEAST INFECTION  . Tegretol (Carbamazepine) Other (See Comments)    Increased heart rate  . Morphine And Related Rash    Red line following the vein and itching in the affected area.    Patient Measurements: Height: 5\' 4"  (162.6 cm) Weight: 193 lb 2 oz (87.6 kg) IBW/kg (Calculated) : 54.7  Vital Signs: Temp: 97.7 F (36.5 C) (06/28 1130) Temp src: Oral (06/28 1130) BP: 138/88 mmHg (06/28 1130) Pulse Rate: 148 (06/28 1200)  Labs:  Recent Labs  11/01/12 1150 11/01/12 1935 11/01/12 2216 11/02/12 0352  WBC 9.9  --   --  6.6  HGB 13.4  --   --  11.6*  PLT 263  --   --  167  LABCREA  --   --  183.2  --   CREATININE 4.81* 3.42*  --  2.33*   Estimated Creatinine Clearance: 28.2 ml/min (by C-G formula based on Cr of 2.33).    Microbiology: Recent Results (from the past 720 hour(s))  MRSA PCR SCREENING     Status: None   Collection Time    11/01/12  5:56 PM      Result Value Range Status   MRSA by PCR NEGATIVE  NEGATIVE Final   Comment:            The GeneXpert MRSA Assay (FDA     approved for NASAL specimens     only), is one component of a     comprehensive MRSA colonization     surveillance program. It is not     intended to diagnose MRSA     infection nor to guide or     monitor treatment for     MRSA infections.    Assessment: 57 yo F admitted for opiate withdrawal and vomiting.  PMHx significant for DM, charcot foot, and arthritis.  Zosyn started empirically on 6/27 for Sepsis with possible urinary source. Pt found to be in ARF. SCr continues to improve, CrCl now > 60ml/min. Will increase Zosyn to extended-interval dosing.  Plan:  1.  Increase Zosyn to 3.375 g IV q8h  (infused over 4 hours)  Darrol Angel, PharmD Pager: [phone] 11/02/2012 1:14 PM

## 2012-11-02 NOTE — Progress Notes (Signed)
Morphine 2 mg IV administered via Left arm Iv.  Able to draw back blood easily.  After administered the morphine slowly into infusing IV fluids.  15 minutes later the left arm developed a red line up the arm following the vein.  Patient complained of arm itching.  Notified Dr. Sunnie Nielsen.  Morphine discontinued. Notified pharmacy as well.  No other symptoms.

## 2012-11-02 NOTE — Progress Notes (Signed)
ANTICOAGULATION CONSULT NOTE - Initial Consult  Pharmacy Consult for Heparin  Indication: A.Fib  Allergies  Allergen Reactions  . Contrast Media (Iodinated Diagnostic Agents) Anaphylaxis    IVP-DYE (PATIENT STATES THAT SHE DIED ON THE OPERATING ROOM TABLE)  . Erythromycin Other (See Comments)    SEVERE YEAST INFECTION  . Tegretol (Carbamazepine) Other (See Comments)    Increased heart rate  . Morphine And Related Rash    Red line following the vein and itching in the affected area.    Patient Measurements: Height: 5\' 4"  (162.6 cm) Weight: 193 lb 2 oz (87.6 kg) IBW/kg (Calculated) : 54.7 Heparin Dosing Weight: 74.1 kg  Vital Signs: Temp: 97.7 F (36.5 C) (06/28 1130) Temp src: Oral (06/28 1130) BP: 138/88 mmHg (06/28 1130) Pulse Rate: 148 (06/28 1200)  Labs:  Recent Labs  11/01/12 1150 11/01/12 1610 11/01/12 1935 11/01/12 2230 11/02/12 0352  HGB 13.4  --   --   --  11.6*  HCT 41.3  --   --   --  37.5  PLT 263  --   --   --  167  CREATININE 4.81*  --  3.42*  --  2.33*  TROPONINI  --  <0.30  --  <0.30 <0.30    Estimated Creatinine Clearance: 28.2 ml/min (by C-G formula based on Cr of 2.33).   Medical History: Past Medical History  Diagnosis Date  . Diabetes mellitus without complication   . Arthritis   . Neuropathy   . Charcot foot due to diabetes mellitus   . PONV (postoperative nausea and vomiting)     Medications:  Prescriptions prior to admission  Medication Sig Dispense Refill  . amitriptyline (ELAVIL) 25 MG tablet Take 25 mg by mouth at bedtime.      . cyclobenzaprine (FLEXERIL) 10 MG tablet Take 10 mg by mouth 3 (three) times daily as needed for muscle spasms.      Marland Kitchen estradiol (ESTRING) 2 MG vaginal ring Place 2 mg vaginally every 3 (three) months. follow package directions      . gabapentin (NEURONTIN) 800 MG tablet Take 800 mg by mouth 5 (five) times daily.      Marland Kitchen HYDROcodone-acetaminophen (NORCO) 7.5-325 MG per tablet Take 2 tablets by mouth  every 6 (six) hours as needed for pain.      Marland Kitchen lisinopril (PRINIVIL,ZESTRIL) 10 MG tablet Take 10 mg by mouth daily.      . metFORMIN (GLUCOPHAGE) 500 MG tablet Take 500 mg by mouth 5 (five) times daily.      . metFORMIN (GLUCOPHAGE-XR) 500 MG 24 hr tablet Take 2,500 mg by mouth daily with breakfast.      . morphine (MS CONTIN) 60 MG 12 hr tablet Take 60 mg by mouth 2 (two) times daily.      Marland Kitchen nystatin (MYCOSTATIN/NYSTOP) 100000 UNIT/GM POWD Apply 1 g topically 3 (three) times daily.      Marland Kitchen omeprazole (PRILOSEC) 20 MG capsule Take 20 mg by mouth every morning.      . ondansetron (ZOFRAN-ODT) 8 MG disintegrating tablet Take 8 mg by mouth every 8 (eight) hours as needed for nausea.       Scheduled:  . amitriptyline  25 mg Oral QHS  . doxycycline  100 mg Oral Q12H  . gabapentin  600 mg Oral q morning - 10a  . insulin aspart  0-9 Units Subcutaneous TID WC  . metoprolol tartrate  12.5 mg Oral Q6H  . nystatin  1 g Topical TID  .  pantoprazole (PROTONIX) IV  40 mg Intravenous Q12H  . piperacillin-tazobactam (ZOSYN)  IV  2.25 g Intravenous Q8H   Infusions:  . sodium chloride 125 mL/hr (11/02/12 0900)   PRN: acetaminophen, acetaminophen, cyclobenzaprine, HYDROcodone-acetaminophen, metoprolol, ondansetron (ZOFRAN) IV, ondansetron Anti-infectives   Start     Dose/Rate Route Frequency Ordered Stop   11/02/12 1000  doxycycline (VIBRA-TABS) tablet 100 mg     100 mg Oral Every 12 hours 11/02/12 0802     11/01/12 2300  piperacillin-tazobactam (ZOSYN) IVPB 2.25 g     2.25 g 100 mL/hr over 30 Minutes Intravenous 3 times per day 11/01/12 1553     11/01/12 1430  piperacillin-tazobactam (ZOSYN) IVPB 3.375 g     3.375 g 100 mL/hr over 30 Minutes Intravenous  Once 11/01/12 1419 11/01/12 1537      Assessment:  57 yo F with atrial flutter with RVR to start Heparin per pharmacy with plans for possible TEE DCCV in the future.  Cardiology following.    Baseline PT/INR ordered now  Hgb and plt  okay  ARF, likely secondary to dehydration and ACE-I (held), improving now with Scr 2.3, CrCl 28 ml/min   Goal of Therapy:  Heparin level 0.3-0.7 units/ml Monitor platelets by anticoagulation protocol: Yes   Plan:  1.) Stat PT/INR 2.) SQ heparin for VTE px discontinued - last dose 6/28 at 0605.   3.) After baseline PT/INR drawn, give Heparin 4000 units x 1 followed by 1100 units/hr 4.) Check 8 hour heparin level given acute renal failure  5.) Daily Heparin level and CBC  Zoey Gilkeson, Loma Messing PharmD Pager #: [phone] 12:26 PM 11/02/2012

## 2012-11-02 NOTE — Progress Notes (Signed)
Echo Lab  2D Echocardiogram completed.  Dorthula Bier L Marylon Verno, RDCS 11/02/2012 11:19 AM

## 2012-11-02 NOTE — Progress Notes (Addendum)
[facility] PROGRESS NOTE  Rebecca Brennan:[id number] DOB: 01/22/1956 DOA: 11/01/2012 PCP: No primary provider on file.  Assessment/Plan:  1-Acute Renal failure; Probably pre renal, in setting dehydration, ACE, probably UTI. Fena at 1.6, probably component of intrinsic renal diseases.  Continue with IV fluids.  [place] negative for obstruction. Renal function improved, Cr decrease to 2.3. Cr peak to 4.8. Replete K with one time dose of potasium.   2-Lactic acidosis/Sepsis: This could be secondary to UTI, but also in setting of renal failure and metformin intake. IV fluids, stop metformin, blood culture pending.  patient. BP improved after IV fluids. Continue with Zosyn pre pharmacy to cover for UTI and intraabdominal process. [place] negative for cholecystitis. Lactic acid decrease to 1.2.  3-Chronic pain syndrome/ Drug withdrawal: Her symptoms of nausea and vomiting started prior to stopping taking medication.  Will continue with morphine PRN, Vicodin to avoid worsening withdrawal symptoms. Gabapentin adjusted for renal function. She would like to go to [facility] to get help for detox. Psych consulted.   4-Nausea/vomiting: This could be secondary to infection, maybe component of withdrawal. CT abdomen show thickening of the wall of the gallbladder fundus. Continue with IV protonix. Abdominal US negative. Nausea and vomiting resolved. Will start Clear diet advance as tolerated.   5-EKG abnormalities/ Atrial Flutter: Diffuse ST elevation, subsequently EKG A flutter. In setting of electrolytes abnormalities, Hypotension.  cardiac enzymes times 2 negative, Repeat EKG in am, ECHO ordered. PRN lopressor. Cardiology consulted.   6-Hyperkalemia: Received Kayexalate, and calcium gluconate. Resolved. Now with hypokalemia.   7-UTI: UA with small leukocytes. Zosyn should cover. Follow urine culture.   8-Focal thickening involving the wall of the gallbladder fundus. Abdominal US negative.    9-Diabetes:  discontinue metformin. SSI. 10-Left 2 finger with ulcer: Patient burn her finger. Will start Doxy. Wound care consulted. Will check X ray.    Code Status: Full Family Communication: Care discussed with patient.  Disposition Plan: Remain inpatient.    Consultants:  [facility].   Procedures:  none  Antibiotics:  Zosyn 6-27  Doxy 6-28  HPI/Subjective: Sitting in bedside commode. No significant complain other than her chronic back pain.   Objective: Filed Vitals:   11/02/12 0400 11/02/12 0500 11/02/12 0600 11/02/12 0700  BP: 88/69 119/82 114/80 122/79  Pulse:  82  94  Temp: 97.9 F (36.6 C)     TempSrc: Oral     Resp: 17 17 18 17   Height:      Weight:  87.6 kg (193 lb 2 oz)    SpO2: 98% 98% 98% 96%    Intake/Output Summary (Last 24 hours) at 11/02/12 0809 Last data filed at 11/02/12 9562  Gross per 24 hour  Intake 2439.58 ml  Output    500 ml  Net 1939.58 ml   Filed Weights   11/01/12 1551 11/01/12 1730 11/02/12 0500  Weight: 82.555 kg (182 lb) 86.3 kg (190 lb 4.1 oz) 87.6 kg (193 lb 2 oz)    Exam:   General:  No distress.   Cardiovascular: S 1, S 2 IRR  Respiratory: CTA  Abdomen: Bs present, soft, NT  Musculoskeletal: no edema. Left second finger with ulcer mild redness.   Data Reviewed: Basic Metabolic Panel:  Recent Labs Lab 11/01/12 1150 11/01/12 1935 11/02/12 0352  NA 136 137 136  K 5.8* 4.4 3.3*  CL 89* 96 98  CO2 26 28 27   GLUCOSE 197* 103* 144*  BUN 39* 35* 30*  CREATININE  4.81* 3.42* 2.33*  CALCIUM 8.4 7.4* 7.0*   Liver Function Tests:  Recent Labs Lab 11/01/12 1150  AST 22  ALT 15  ALKPHOS 77  BILITOT 0.4  PROT 7.7  ALBUMIN 4.2    Recent Labs Lab 11/01/12 1150  LIPASE 17   No results found for this basename: AMMONIA,  in the last 168 hours CBC:  Recent Labs Lab 11/01/12 1150 11/02/12 0352  WBC 9.9 6.6  NEUTROABS 6.1  --   HGB 13.4 11.6*  HCT 41.3 37.5  MCV 83.8 84.1  PLT 263 167    Cardiac Enzymes:  Recent Labs Lab 11/01/12 1610 11/01/12 2230 11/02/12 0352  TROPONINI <0.30 <0.30 <0.30   BNP (last 3 results) No results found for this basename: PROBNP,  in the last 8760 hours CBG:  Recent Labs Lab 11/01/12 1802 11/01/12 2158  GLUCAP 115* 82    Recent Results (from the past 240 hour(s))  MRSA PCR SCREENING     Status: None   Collection Time    11/01/12  5:56 PM      Result Value Range Status   MRSA by PCR NEGATIVE  NEGATIVE Final   Comment:            The GeneXpert MRSA Assay (FDA     approved for NASAL specimens     only), is one component of a     comprehensive MRSA colonization     surveillance program. It is not     intended to diagnose MRSA     infection nor to guide or     monitor treatment for     MRSA infections.     Studies: Ct Abdomen Pelvis Wo Contrast  11/01/2012   *RADIOLOGY REPORT*  Clinical Data: Abdominal pain.  Nausea and vomiting.  Surgical history includes appendectomy, cesarean section, and pessary. Renal insufficiency and prior anaphylactic reaction to iodinated contrast precluded IV contrast administration.  CT ABDOMEN AND PELVIS WITHOUT CONTRAST  Technique:  Multidetector CT imaging of the abdomen and pelvis was performed following the standard protocol without intravenous contrast.  Oral contrast was administered.  Comparison: Report of CT abdomen and pelvis 11/25/2010 from [facility], describing pericardial fluid but no acute findings in the abdomen or pelvis.  The images themselves are not currently available for direct comparison.  Findings: Calcified granulomata in the liver which is otherwise unremarkable for the unenhanced technique.  Normal unenhanced appearance of the spleen and adrenal glands.  Lobular contour to both kidneys consistent with fetal lobulations.  Very small (2 mm) calculus in a lower pole calix of the left kidney without obstruction.  No urinary tract calculi elsewhere on either side.  Severe diffuse pancreatic atrophy.  Focal wall thickening involving the independent portion of the gallbladder fundus.  No calcified gallstones.  No biliary ductal dilation.  Mild to moderate aorto- iliofemoral atherosclerosis.  No significant lymphadenopathy.  Oral contrast material within the distal esophagus.  No evidence of hiatal hernia.  Stomach normal in appearance.  Normal-appearing small bowel.  Apparent wall thickening involving the descending colon, sigmoid colon, and rectum is felt to be due to the fact that these portions of the colon are decompressed, as there is no pericolonic inflammation.  Appendix surgically absent.  No ascites.  Pessary device in the pelvis.  Urinary bladder decompressed. Uterus mildly atrophic consistent with age.  Ovaries normal by CT. No free pelvic fluid.  Numerous pelvic phleboliths.  Bone window images demonstrate severe degenerative changes involving the lower thoracic  and lumbar spine.  Visualized lung bases clear.  Heart size normal.  No evidence of pericardial effusion, though there may be mild pericardial thickening.  IMPRESSION:  1.  No acute abnormalities involving the abdomen or pelvis. 2.  Severe pancreatic atrophy. 3.  Focal thickening involving the wall of the gallbladder fundus. Non-emergent right upper quadrant abdominal ultrasound is suggested to exclude a gallbladder tumor. 4.  Non-obstructing 2 mm left lower pole renal calculus.  No urinary tract calculi elsewhere on either side. 5.  Oral contrast material in the distal esophagus without evidence of hiatal hernia.  Query GE reflux and/or esophageal dysmotility. 6.  Mild pericardial thickening without evidence of pericardial effusion.   Original Report Authenticated By: Hulan Saas, M.D.   US Abdomen Complete  11/01/2012   *RADIOLOGY REPORT*  Clinical Data:  Nausea and vomiting.  Renal insufficiency.  ABDOMEN ULTRASOUND  Technique:  Complete abdominal ultrasound examination was performed including  evaluation of the liver, gallbladder, bile ducts, pancreas, kidneys, spleen, IVC, and abdominal aorta.  Comparison:  CT of the abdomen and pelvis earlier today.  Findings:  Gallbladder:  No shadowing gallstones or echogenic sludge.  No gallbladder wall thickening or pericholecystic fluid.  Negative sonographic Murphy's sign according to the ultrasound technologist. No gallbladder mass is seen at the level of the question focal thickening by CT.  Common Bile Duct:  Normal caliber of 3 mm.  Liver:  No focal mass lesion seen.  Within normal limits in parenchymal echogenicity.  IVC:  Patent throughout its visualized course in the abdomen.  Pancreas:  Although the pancreas is difficult to visualize in its entirety, no focal pancreatic abnormality is identified.  Spleen:  The spleen is of normal echotexture and size.  Kidneys:  The right kidney measures approximately 13.1 cm and the left kidney 12.5 cm.  Both kidneys show no evidence of hydronephrosis.  There is suggestion of mild cortical thinning on the right.  No focal renal masses are identified.  Abdominal Aorta:  Normal caliber abdominal aorta.  IMPRESSION: Unremarkable abdominal ultrasound.   Original Report Authenticated By: Irish Lack, M.D.   Dg Chest Portable 1 View  11/01/2012   *RADIOLOGY REPORT*  Clinical Data: Diabetes, nonsmoker  PORTABLE CHEST - 1 VIEW  Comparison: Portable exam 1222 hours without priors for comparison  Findings: Upper normal heart size. Mediastinal contours and pulmonary vascularity normal. Minimal bronchitic changes with atelectasis or scarring lingula. Lungs otherwise clear. No pleural effusion or pneumothorax. Bones unremarkable.  IMPRESSION: Minimal bronchitic changes with lingular atelectasis versus scarring. No additional abnormalities.   Original Report Authenticated By: Ulyses Southward, M.D.    Scheduled Meds: . amitriptyline  25 mg Oral QHS  . doxycycline  100 mg Oral Q12H  . gabapentin  600 mg Oral q morning - 10a  .  heparin  5,000 Units Subcutaneous Q8H  . insulin aspart  0-9 Units Subcutaneous TID WC  . nystatin  1 g Topical TID  . pantoprazole (PROTONIX) IV  40 mg Intravenous Q12H  . piperacillin-tazobactam (ZOSYN)  IV  2.25 g Intravenous Q8H  . potassium chloride  20 mEq Oral Once   Continuous Infusions: . sodium chloride      Active Problems:   Acute renal failure   Lactic acidosis   Tachycardia   Nausea & vomiting    Time spent: 35 minutes.     Sonna Lipsky  [facility] Pager [phone]. If 7PM-7AM, please contact night-coverage at [email or web address], password [email or web address] 11/02/2012, 8:09 AM  LOS: 1 day

## 2012-11-02 NOTE — Consult Note (Signed)
Pt. Seen and examined. Agree with the NP/PA-C note as written.  Pleasant 57 yo female with presentation for narcotic detox, who now has nausea and diarrhea with signs of possible infection. Her EKG demonstrates atrial flutter with RVR. I just reviewed her echo which shows normal chamber sizes and preserved LV function. It is unclear how long she has been in flutter. Rate control is suboptimal, but she is responding to IV lopressor - this has been complicated by hypotension. Will plan to increase her short-acting lopressor dose to q6 hrs. Could consider digoxin loading in addition if hypotension continues to be an issue. Will add IV heparin. If she does not convert spontaneously, we could schedule TEE/Cardioversion for Tuesday.  Will follow with you. Thanks for the consult.  Chrystie Nose, MD, [facility] Attending Cardiologist [facility]
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
## 2012-11-02 NOTE — Plan of Care (Signed)
Problem: Phase I Progression Outcomes Goal: OOB as tolerated unless otherwise ordered Outcome: Progressing Up to bedside commode with two assist. Severe weakness. Goal: Voiding-avoid urinary catheter unless indicated Outcome: Progressing Voided bedside commode.  Goal: Hemodynamically stable Outcome: Progressing Aflutter continues.

## 2012-11-03 DIAGNOSIS — I4892 Unspecified atrial flutter: Principal | ICD-10-CM

## 2012-11-03 LAB — GLUCOSE, CAPILLARY
Glucose-Capillary: 254 mg/dL — ABNORMAL HIGH (ref 70–99)
Glucose-Capillary: 201 mg/dL — ABNORMAL HIGH (ref 70–99)

## 2012-11-03 LAB — BASIC METABOLIC PANEL
BUN: 13 mg/dL (ref 6–23)
CO2: 26 mEq/L (ref 19–32)
Chloride: 103 mEq/L (ref 96–112)
GFR calc Af Amer: 78 mL/min — ABNORMAL LOW (ref >90)
Potassium: 3.8 mEq/L (ref 3.5–5.1)

## 2012-11-03 LAB — CBC
HCT: 36.4 % (ref 36.0–46.0)
Hemoglobin: 11.3 g/dL — ABNORMAL LOW (ref 12.0–15.0)
MCHC: 31 g/dL (ref 30.0–36.0)

## 2012-11-03 LAB — HEPARIN LEVEL (UNFRACTIONATED)
Heparin Unfractionated: 0.79 IU/mL — ABNORMAL HIGH (ref 0.30–0.70)
Heparin Unfractionated: 0.46 IU/mL (ref 0.30–0.70)
Heparin Unfractionated: 0.46 IU/mL (ref 0.30–0.70)

## 2012-11-03 MED ORDER — METOPROLOL TARTRATE 50 MG PO TABS
50.0000 mg | ORAL_TABLET | Freq: Two times a day (BID) | ORAL | Status: DC
  Administered 2012-11-03: 50 mg via ORAL
  Filled 2012-11-03 (×3): qty 1

## 2012-11-03 MED ORDER — HEPARIN (PORCINE) IN NACL 100-0.45 UNIT/ML-% IJ SOLN
800.0000 [IU]/h | INTRAMUSCULAR | Status: DC
  Administered 2012-11-03 – 2012-11-04 (×2): 800 [IU]/h via INTRAVENOUS
  Filled 2012-11-03 (×2): qty 250

## 2012-11-03 MED ORDER — HEPARIN (PORCINE) IN NACL 100-0.45 UNIT/ML-% IJ SOLN
1000.0000 [IU]/h | INTRAMUSCULAR | Status: AC
  Administered 2012-11-03: 1000 [IU]/h via INTRAVENOUS
  Filled 2012-11-03 (×2): qty 250

## 2012-11-03 MED ORDER — DIGOXIN 250 MCG PO TABS
0.2500 mg | ORAL_TABLET | Freq: Every day | ORAL | Status: DC
  Administered 2012-11-04 – 2012-11-06 (×3): 0.25 mg via ORAL
  Filled 2012-11-03 (×3): qty 1

## 2012-11-03 MED ORDER — DIGOXIN 250 MCG PO TABS
0.5000 mg | ORAL_TABLET | Freq: Once | ORAL | Status: AC
  Administered 2012-11-03: 0.5 mg via ORAL
  Filled 2012-11-03: qty 2

## 2012-11-03 MED ORDER — GABAPENTIN 400 MG PO CAPS
400.0000 mg | ORAL_CAPSULE | Freq: Four times a day (QID) | ORAL | Status: DC
  Administered 2012-11-03 (×4): 400 mg via ORAL
  Filled 2012-11-03 (×8): qty 1

## 2012-11-03 MED ORDER — METOPROLOL TARTRATE 25 MG PO TABS
25.0000 mg | ORAL_TABLET | Freq: Once | ORAL | Status: AC
  Administered 2012-11-03: 25 mg via ORAL
  Filled 2012-11-03: qty 1

## 2012-11-03 MED ORDER — DIGOXIN 250 MCG PO TABS
0.2500 mg | ORAL_TABLET | ORAL | Status: AC
  Administered 2012-11-03 (×2): 0.25 mg via ORAL
  Filled 2012-11-03 (×2): qty 1

## 2012-11-03 MED ORDER — PANTOPRAZOLE SODIUM 40 MG PO TBEC
40.0000 mg | DELAYED_RELEASE_TABLET | Freq: Every day | ORAL | Status: DC
  Administered 2012-11-03 – 2012-11-06 (×4): 40 mg via ORAL
  Filled 2012-11-03 (×4): qty 1
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
## 2012-11-03 NOTE — Progress Notes (Signed)
ANTICOAGULATION CONSULT NOTE - Follow Up  Pharmacy Consult for Heparin  Indication: A.Fib  Labs:  Recent Labs  11/01/12 1150 11/01/12 1610 11/01/12 1935 11/01/12 2230 11/02/12 0352 11/02/12 1316 11/02/12 2212 11/03/12 0401 11/03/12 0919 11/03/12 1706  HGB 13.4  --   --   --  11.6*  --   --  11.3*  --   --   HCT 41.3  --   --   --  37.5  --   --  36.4  --   --   PLT 263  --   --   --  167  --   --  178  --   --   LABPROT  --   --   --   --   --  15.6*  --   --   --   --   INR  --   --   --   --   --  1.27  --   --   --   --   HEPARINUNFRC  --   --   --   --   --   --  0.76*  --  0.79* 0.46  CREATININE 4.81*  --  3.42*  --  2.33*  --   --  0.92  --   --   TROPONINI  --  <0.30  --  <0.30 <0.30  --   --   --   --   --    Infusions:  . sodium chloride 75 mL/hr at 11/03/12 1126  . heparin 800 Units/hr (11/03/12 1700)    Assessment: 57 yo F with atrial flutter with RVR. IV heparin started 6/28am. Cardiology following.    Heparin level (0.46) is therapeutic.    Rn confirms currently running at 800 units/hr.  No problems with infusion or bleeding events.  Goal of Therapy:  Heparin level 0.3-0.7 units/ml Monitor platelets by anticoagulation protocol: Yes   Plan:  1) Continue IV heparin at 800 units/hr 2) Recheck HL in 6 hours to confirm therapeutic.   Lynann Beaver PharmD, BCPS Pager [phone] 11/03/2012 5:28 PM

## 2012-11-03 NOTE — Progress Notes (Signed)
ANTICOAGULATION CONSULT NOTE - Follow Up  Pharmacy Consult for Heparin  Indication: A.Fib  Allergies  Allergen Reactions  . Contrast Media (Iodinated Diagnostic Agents) Anaphylaxis    IVP-DYE (PATIENT STATES THAT SHE DIED ON THE OPERATING ROOM TABLE)  . Erythromycin Other (See Comments)    SEVERE YEAST INFECTION  . Tegretol (Carbamazepine) Other (See Comments)    Increased heart rate  . Morphine And Related Rash    Red line following the vein and itching in the affected area.    Patient Measurements: Height: 5\' 4"  (162.6 cm) Weight: 191 lb 9.3 oz (86.9 kg) IBW/kg (Calculated) : 54.7 Heparin Dosing Weight: 74.1 kg  Vital Signs: Temp: 98.5 F (36.9 C) (06/29 0745) Temp src: Oral (06/29 0745) BP: 143/84 mmHg (06/29 0745) Pulse Rate: 120 (06/29 0922)  Labs:  Recent Labs  11/01/12 1150 11/01/12 1610 11/01/12 1935 11/01/12 2230 11/02/12 0352 11/02/12 1316 11/02/12 2212 11/03/12 0401 11/03/12 0919  HGB 13.4  --   --   --  11.6*  --   --  11.3*  --   HCT 41.3  --   --   --  37.5  --   --  36.4  --   PLT 263  --   --   --  167  --   --  178  --   LABPROT  --   --   --   --   --  15.6*  --   --   --   INR  --   --   --   --   --  1.27  --   --   --   HEPARINUNFRC  --   --   --   --   --   --  0.76*  --  0.79*  CREATININE 4.81*  --  3.42*  --  2.33*  --   --  0.92  --   TROPONINI  --  <0.30  --  <0.30 <0.30  --   --   --   --     Estimated Creatinine Clearance: 71.1 ml/min (by C-G formula based on Cr of 0.92).   Medications:  Scheduled:  . amitriptyline  25 mg Oral QHS  . doxycycline  100 mg Oral Q12H  . gabapentin  400 mg Oral QID  . insulin aspart  0-9 Units Subcutaneous TID WC  . metoprolol tartrate  50 mg Oral BID  . metoprolol tartrate  25 mg Oral Once  . nystatin  1 g Topical TID  . pantoprazole  40 mg Oral Daily   Infusions:  . sodium chloride 75 mL/hr (11/03/12 0821)  . heparin 1,000 Units/hr (11/03/12 0900)    Assessment: 57 yo F with atrial  flutter with RVR. IV heparin started 6/28am. Cardiology following.    Heparin level remains slightly supratherapeutic - currently running at 1000 units/hr  No problems with infusion or bleeding events per RN report.  Plts wnl, SCr now wnl, Hgb slightly low  Plan for TEE DCCV on Tuesday noted  Goal of Therapy:  Heparin level 0.3-0.7 units/ml Monitor platelets by anticoagulation protocol: Yes   Plan:  1) Decrease IV heparin to 800 units/hr 2) Recheck HL in 6 hours  Darrol Angel, PharmD Pager: [phone] 11/03/2012 10:32 AM

## 2012-11-03 NOTE — Progress Notes (Addendum)
ANTICOAGULATION CONSULT NOTE - Follow Up Consult  Pharmacy Consult for Heparin Indication: atrial fibrillation  Allergies  Allergen Reactions  . Contrast Media (Iodinated Diagnostic Agents) Anaphylaxis    IVP-DYE (PATIENT STATES THAT SHE DIED ON THE OPERATING ROOM TABLE)  . Erythromycin Other (See Comments)    SEVERE YEAST INFECTION  . Tegretol (Carbamazepine) Other (See Comments)    Increased heart rate  . Morphine And Related Rash    Red line following the vein and itching in the affected area.    Patient Measurements: Height: 5\' 4"  (162.6 cm) Weight: 193 lb 2 oz (87.6 kg) IBW/kg (Calculated) : 54.7 Heparin Dosing Weight:   Vital Signs: Temp: 98.3 F (36.8 C) (06/28 1644) Temp src: Oral (06/28 1644) BP: 132/80 mmHg (06/29 0000) Pulse Rate: 98 (06/29 0000)  Labs:  Recent Labs  11/01/12 1150 11/01/12 1610 11/01/12 1935 11/01/12 2230 11/02/12 0352 11/02/12 1316 11/02/12 2212  HGB 13.4  --   --   --  11.6*  --   --   HCT 41.3  --   --   --  37.5  --   --   PLT 263  --   --   --  167  --   --   LABPROT  --   --   --   --   --  15.6*  --   INR  --   --   --   --   --  1.27  --   HEPARINUNFRC  --   --   --   --   --   --  0.76*  CREATININE 4.81*  --  3.42*  --  2.33*  --   --   TROPONINI  --  <0.30  --  <0.30 <0.30  --   --     Estimated Creatinine Clearance: 28.2 ml/min (by C-G formula based on Cr of 2.33).   Medications:  Infusions:  . sodium chloride 125 mL/hr (11/02/12 1808)  . heparin      Assessment: Patient with high heparin level.  No issues per RN.  Goal of Therapy:  Heparin level 0.3-0.7 units/ml Monitor platelets by anticoagulation protocol: Yes   Plan:  Decrease to 1000 units/hr, recheck level at 0900  Darlina Guys, Jacquenette Shone Crowford 11/03/2012,12:59 AM
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
## 2012-11-03 NOTE — Progress Notes (Signed)
Pt. Seen and examined. Agree with the NP/PA-C note as written.  HR control remains suboptimal today. She got a loading dose of digoxin 0.5 mg this morning. Renal function has normalized. Will continue loading doses today and have written for maintenance dose digoxin starting on Monday. Continue lopressor q6hrs. Plan for TEE/Cardioversion on Tuesday if she has not converted. I'm told by nursing that IV access may be a problem. She will need a suitable IV for TEE procedure on Tuesday - if no peripheral access can be maintained, consider a PICC line.  Chrystie Nose, MD, [facility] Attending Cardiologist [facility]

## 2012-11-04 DIAGNOSIS — E1140 Type 2 diabetes mellitus with diabetic neuropathy, unspecified: Secondary | ICD-10-CM | POA: Diagnosis present

## 2012-11-04 DIAGNOSIS — R000 Tachycardia, unspecified: Secondary | ICD-10-CM

## 2012-11-04 DIAGNOSIS — I10 Essential (primary) hypertension: Secondary | ICD-10-CM | POA: Diagnosis present

## 2012-11-04 DIAGNOSIS — E1149 Type 2 diabetes mellitus with other diabetic neurological complication: Secondary | ICD-10-CM | POA: Diagnosis present

## 2012-11-04 DIAGNOSIS — F1120 Opioid dependence, uncomplicated: Secondary | ICD-10-CM | POA: Diagnosis present

## 2012-11-04 LAB — CBC
Hemoglobin: 11.6 g/dL — ABNORMAL LOW (ref 12.0–15.0)
MCHC: 31.6 g/dL (ref 30.0–36.0)
Platelets: 150 10*3/uL (ref 150–400)
RDW: 15.6 % — ABNORMAL HIGH (ref 11.5–15.5)

## 2012-11-04 LAB — GLUCOSE, CAPILLARY
Glucose-Capillary: 133 mg/dL — ABNORMAL HIGH (ref 70–99)
Glucose-Capillary: 110 mg/dL — ABNORMAL HIGH (ref 70–99)
Glucose-Capillary: 213 mg/dL — ABNORMAL HIGH (ref 70–99)
Glucose-Capillary: 178 mg/dL — ABNORMAL HIGH (ref 70–99)

## 2012-11-04 LAB — BASIC METABOLIC PANEL
Calcium: 8 mg/dL — ABNORMAL LOW (ref 8.4–10.5)
GFR calc Af Amer: >90 mL/min (ref >90)
GFR calc non Af Amer: >90 mL/min (ref >90)
Sodium: 137 mEq/L (ref 135–145)

## 2012-11-04 LAB — HEPARIN LEVEL (UNFRACTIONATED): Heparin Unfractionated: 0.42 IU/mL (ref 0.30–0.70)

## 2012-11-04 LAB — TSH: TSH: 0.368 u[IU]/mL (ref 0.350–4.500)

## 2012-11-04 MED ORDER — GABAPENTIN 300 MG PO CAPS
600.0000 mg | ORAL_CAPSULE | Freq: Four times a day (QID) | ORAL | Status: DC
  Administered 2012-11-04 – 2012-11-06 (×9): 600 mg via ORAL
  Filled 2012-11-04 (×14): qty 2

## 2012-11-04 MED ORDER — DOCUSATE SODIUM 100 MG PO CAPS
100.0000 mg | ORAL_CAPSULE | Freq: Two times a day (BID) | ORAL | Status: DC
  Administered 2012-11-04 – 2012-11-06 (×5): 100 mg via ORAL
  Filled 2012-11-04 (×6): qty 1

## 2012-11-04 MED ORDER — METOPROLOL TARTRATE 50 MG PO TABS
50.0000 mg | ORAL_TABLET | Freq: Two times a day (BID) | ORAL | Status: DC
  Administered 2012-11-04 – 2012-11-06 (×5): 50 mg via ORAL
  Filled 2012-11-04 (×6): qty 1

## 2012-11-04 NOTE — Progress Notes (Signed)
Inpatient Diabetes Program Recommendations  AACE/ADA: New Consensus Statement on Inpatient Glycemic Control (2013)  Target Ranges:  Prepandial:   less than 140 mg/dL      Peak postprandial:   less than 180 mg/dL (1-2 hours)      Critically ill patients:  140 - 180 mg/dL   Reason for Visit: Hyperglycemia  On metformin at home.   ? Glucose control prior to hospitalization  Inpatient Diabetes Program Recommendations HgbA1C: Check HgbA1C to assess glycemic control prior to hospitalization  Note: Will follow.  Thank you. Ailene Ards, RD, LDN, CDE Inpatient Diabetes Coordinator [phone]

## 2012-11-04 NOTE — Progress Notes (Signed)
ANTICOAGULATION CONSULT NOTE - Follow Up Consult  Pharmacy Consult for Heparin Indication: atrial fibrillation  Allergies  Allergen Reactions  . Contrast Media (Iodinated Diagnostic Agents) Anaphylaxis    IVP-DYE (PATIENT STATES THAT SHE DIED ON THE OPERATING ROOM TABLE)  . Erythromycin Other (See Comments)    SEVERE YEAST INFECTION  . Tegretol (Carbamazepine) Other (See Comments)    Increased heart rate  . Morphine And Related Rash    Red line following the vein and itching in the affected area.    Patient Measurements: Height: 5\' 4"  (162.6 cm) Weight: 191 lb 9.3 oz (86.9 kg) IBW/kg (Calculated) : 54.7 Heparin Dosing Weight:   Vital Signs: Temp: 97.9 F (36.6 C) (06/30 0000) Temp src: Oral (06/30 0000) BP: 143/86 mmHg (06/30 0013) Pulse Rate: 75 (06/30 0013)  Labs:  Recent Labs  11/01/12 1150 11/01/12 1610 11/01/12 1935 11/01/12 2230 11/02/12 0352 11/02/12 1316  11/03/12 0401 11/03/12 0919 11/03/12 1706 11/03/12 2325  HGB 13.4  --   --   --  11.6*  --   --  11.3*  --   --   --   HCT 41.3  --   --   --  37.5  --   --  36.4  --   --   --   PLT 263  --   --   --  167  --   --  178  --   --   --   LABPROT  --   --   --   --   --  15.6*  --   --   --   --   --   INR  --   --   --   --   --  1.27  --   --   --   --   --   HEPARINUNFRC  --   --   --   --   --   --   < >  --  0.79* 0.46 0.46  CREATININE 4.81*  --  3.42*  --  2.33*  --   --  0.92  --   --   --   TROPONINI  --  <0.30  --  <0.30 <0.30  --   --   --   --   --   --   < > = values in this interval not displayed.  Estimated Creatinine Clearance: 71.1 ml/min (by C-G formula based on Cr of 0.92).   Medications:  Infusions:  . sodium chloride 75 mL/hr at 11/03/12 1126  . heparin 800 Units/hr (11/04/12 0200)    Assessment: Patient with heparin level at goal.  Goal of Therapy:  Heparin level 0.3-0.7 units/ml Monitor platelets by anticoagulation protocol: Yes   Plan:  Continue heparin at current  rate.  Start with daily heparin levels on 6/30 am  Aleene Davidson Crowford 11/04/2012,2:51 AM

## 2012-11-04 NOTE — Clinical Social Work Psychosocial (Signed)
Clinical Social Work Department BRIEF PSYCHOSOCIAL ASSESSMENT 11/04/2012  Patient:  Rebecca Brennan, Rebecca Brennan     Account Number:  [id number]     Admit date:  11/01/2012  Clinical Social Worker:  Jodelle Red  Date/Time:  11/04/2012 12:18 PM  Referred by:  Physician  Date Referred:  11/02/2012 Referred for  Other - See comment   Other Referral:   OPIATE DETOX   Interview type:  Patient Other interview type:   HUSBAND    PSYCHOSOCIAL DATA Living Status:  HUSBAND Admitted from facility:   Level of care:   Primary support name:  Gwynne Edinger Primary support relationship to patient:  SPOUSE Degree of support available:   GOOD FROM HUSBAND AND SON    CURRENT CONCERNS Current Concerns  Other - See comment   Other Concerns:   Pt wants to detox from her pain meds    SOCIAL WORK ASSESSMENT / PLAN CSW met with Pt and her husband to discuss concerns. They report they were told by their family MD to go to [facility] for opiate detox. They did not involve the pain clinic in this plan. Pt eager for Pt to do inpt detox, but aren't sure if she needs this currently. CSW provided Pt and husband list of opiate detox options. Many of these programs are outpt. CSW will follow and assist, but it sounds like the plan is to return home.   Assessment/plan status:  Psychosocial Support/Ongoing Assessment of Needs Other assessment/ plan:   gave list of opiate detox programs   Information/referral to community resources:    PATIENT'S/FAMILY'S RESPONSE TO PLAN OF CARE: CSW encouraged Pt and husband to engage MD in the discussion of contacting her pain clinic as they know her case. CSW asked family why they did not start there as that is who prescribes the meds. Husband states they are eager for a lifestyle change. It may be helpful to consult pharmacy on this case. CSW will follow.    Doreen Salvage, LCSW ICU/Stepdown Clinical Social Worker [facility] Cell [phone]  Hours 8am-1200pm M-F

## 2012-11-04 NOTE — Progress Notes (Signed)
ANTICOAGULATION CONSULT NOTE - Follow Up Consult  Pharmacy Consult for IV heparin Indication: atrial flutter with RVR  Allergies  Allergen Reactions  . Contrast Media (Iodinated Diagnostic Agents) Anaphylaxis    IVP-DYE (PATIENT STATES THAT SHE DIED ON THE OPERATING ROOM TABLE)  . Erythromycin Other (See Comments)    SEVERE YEAST INFECTION  . Tegretol (Carbamazepine) Other (See Comments)    Increased heart rate  . Morphine And Related Rash    Red line following the vein and itching in the affected area.    Patient Measurements: Height: 5\' 4"  (162.6 cm) Weight: 189 lb 13.1 oz (86.1 kg) IBW/kg (Calculated) : 54.7 Heparin Dosing Weight: 74.1kg  Labs:  Recent Labs  11/01/12 1610  11/01/12 2230  11/02/12 0352 11/02/12 1316  11/03/12 0401  11/03/12 1706 11/03/12 2325 11/04/12 0340  HGB  --   --   --   < > 11.6*  --   --  11.3*  --   --   --  11.6*  HCT  --   --   --   --  37.5  --   --  36.4  --   --   --  36.7  PLT  --   --   --   --  167  --   --  178  --   --   --  150  LABPROT  --   --   --   --   --  15.6*  --   --   --   --   --   --   INR  --   --   --   --   --  1.27  --   --   --   --   --   --   HEPARINUNFRC  --   --   --   --   --   --   < >  --   < > 0.46 0.46 0.42  CREATININE  --   < >  --   --  2.33*  --   --  0.92  --   --   --  0.78  TROPONINI <0.30  --  <0.30  --  <0.30  --   --   --   --   --   --   --   < > = values in this interval not displayed.  Estimated Creatinine Clearance: 81.4 ml/min (by C-G formula based on Cr of 0.78).   Assessment: 58 yo F admitted 6/17 with c/o N/V, wanting to be detoxed found with atrial flutter with RBR.  IV heparin was started with plan for possible TEE/DCCV in the future. Cardiology following. ARF on admit now resolved.   Patient is currently on IV heparin at 800 units/hr.  Heparin level has been therapeutic x 2.  TEE/DCCV scheduled for 2PM tomorrow with Dr. Rennis Golden. CBC okay. No bleeding.  Goal of Therapy:  Heparin  level 0.3-0.7 units/ml Monitor platelets by anticoagulation protocol: Yes   Plan:   Continue Heparin IV at 800 units/hr  Daily heparin level and CBC  F/u anticoagulation plans after cardioversion tomorrow  Geoffry Paradise, PharmD, BCPS Pager: [phone] 12:35 PM Pharmacy #: [id number]

## 2012-11-04 NOTE — Progress Notes (Signed)
[facility] PROGRESS NOTE  Rebecca Brennan ZOX:[id number] DOB: [birth year] DOA: 11/01/2012 PCP: No primary provider on file.  Assessment/Plan:  1-Acute Renal failure;Resolved.  Probably pre renal, in setting dehydration, ACE.  Fena at 1.6, probably component of intrinsic renal diseases. Continue with IV fluids.  [place] negative for obstruction.  Cr peak to 4.8. Renal function improved. Cr has decrease to 0.9. Resolved.   2-Lactic acidosis/Sepsis: Resolved.  in setting of renal failure and metformin intake. IV fluids, stop metformin, blood culture no growth to date.  BP improved after IV fluids. [place] negative for cholecystitis. Lactic acid decrease to 1.2. Received 2 days of Zosyn. Urine culture with no growth. Doxy should cover for UTI.   3-Chronic pain syndrome/ Drug withdrawal:  Morphine discontinue due to local reaction to vein.  Continue with  Vicodin to avoid worsening withdrawal symptoms.  Will increase gabapentin to 600 mg 4 times a day. Patient was taking 800 mg of gabapentin 5 times a day. Psych consult appreciated. I would avoid NSAID for pain due to recent renal failure.   4-Nausea/vomiting: Resolved.  This could be secondary to infection, maybe component of withdrawal. CT abdomen show thickening of the wall of the gallbladder fundus. Abdominal US negative. Nausea and vomiting resolved. Tolerating diet. Change protonix to oral.   5- Atrial Flutter: Diffuse ST elevation, subsequently EKG A flutter. In setting of electrolytes abnormalities, Hypotension.  cardiac enzymes times 2 negative,  PRN lopressor. Cardiology consulted. ECHO with normal EF. On low dose schedule metoprolol. Heparin per cardio. Possible TEE if no cardio conversion to sinus rhythm. Appreciate Cardio help. Started on digoxin 6-29. HR better controlled.   6-Hyperkalemia: Received Kayexalate, and calcium gluconate. Resolved.   7-UTI: unlikely UTI. Urine with no growth. Received 2 days of zosyn.   8-Focal thickening  involving the wall of the gallbladder fundus. Abdominal US negative.    9-Diabetes: discontinue metformin. SSI. 10-Left 2 finger with ulcer: Patient burn her finger. Continue with Doxy for 5 more days.  Wound care consulted. X ray show  arthritis.     Code Status: Full Family Communication: Care discussed with patient.  Disposition Plan: Remain inpatient. Transfer to telemetry when  [facility] by cardio.    Consultants:  [facility].   Procedures:  none  Antibiotics:  Zosyn 6-27  Doxy 6-28  HPI/Subjective: Feeling well. Denies chest pain or dyspnea. Complaining of her usual chronic pain.    Objective: Filed Vitals:   11/04/12 0300 11/04/12 0400 11/04/12 0413 11/04/12 0600  BP:   158/105   Pulse:  79 79 80  Temp:  98.2 F (36.8 C)    TempSrc:  Oral    Resp:  23 22 16   Height:      Weight: 87.4 kg (192 lb 10.9 oz)   86.1 kg (189 lb 13.1 oz)  SpO2:  99% 99% 100%    Intake/Output Summary (Last 24 hours) at 11/04/12 0810 Last data filed at 11/04/12 0600  Gross per 24 hour  Intake   1204 ml  Output   3275 ml  Net  -2071 ml   Filed Weights   11/03/12 0100 11/04/12 0300 11/04/12 0600  Weight: 86.9 kg (191 lb 9.3 oz) 87.4 kg (192 lb 10.9 oz) 86.1 kg (189 lb 13.1 oz)    Exam:   General:  No distress.   Cardiovascular: S 1, S 2 IRR  Respiratory: CTA  Abdomen: Bs present, soft, NT  Musculoskeletal: no edema. Left second finger with ulcer ,  redness decreases.   Data Reviewed: Basic Metabolic Panel:  Recent Labs Lab 11/01/12 1150 11/01/12 1935 11/02/12 0352 11/03/12 0401 11/04/12 0340  NA 136 137 136 138 137  K 5.8* 4.4 3.3* 3.8 3.9  CL 89* 96 98 103 104  CO2 26 28 27 26 24   GLUCOSE 197* 103* 144* 205* 128*  BUN 39* 35* 30* 13 8  CREATININE 4.81* 3.42* 2.33* 0.92 0.78  CALCIUM 8.4 7.4* 7.0* 7.5* 8.0*   Liver Function Tests:  Recent Labs Lab 11/01/12 1150  AST 22  ALT 15  ALKPHOS 77  BILITOT 0.4  PROT 7.7  ALBUMIN 4.2     Recent Labs Lab 11/01/12 1150  LIPASE 17   No results found for this basename: AMMONIA,  in the last 168 hours CBC:  Recent Labs Lab 11/01/12 1150 11/02/12 0352 11/03/12 0401 11/04/12 0340  WBC 9.9 6.6 6.5 7.1  NEUTROABS 6.1  --   --   --   HGB 13.4 11.6* 11.3* 11.6*  HCT 41.3 37.5 36.4 36.7  MCV 83.8 84.1 83.9 85.0  PLT 263 167 178 150   Cardiac Enzymes:  Recent Labs Lab 11/01/12 1610 11/01/12 2230 11/02/12 0352  TROPONINI <0.30 <0.30 <0.30   BNP (last 3 results) No results found for this basename: PROBNP,  in the last 8760 hours CBG:  Recent Labs Lab 11/02/12 2210 11/03/12 0753 11/03/12 1129 11/03/12 1533 11/03/12 2214  GLUCAP 226* 201* 254* 201* 133*    Recent Results (from the past 240 hour(s))  URINE CULTURE     Status: None   Collection Time    11/01/12  1:29 PM      Result Value Range Status   Specimen Description URINE, CLEAN CATCH   Final   Special Requests NONE   Final   Culture  Setup Time 11/01/2012 21:48   Final   Colony Count NO GROWTH   Final   Culture NO GROWTH   Final   Report Status 11/02/2012 FINAL   Final  CULTURE, BLOOD (ROUTINE X 2)     Status: None   Collection Time    11/01/12  3:50 PM      Result Value Range Status   Specimen Description BLOOD L ARM   Final   Special Requests NONE BOTTLES DRAWN AEROBIC AND ANAEROBIC 4CC   Final   Culture  Setup Time 11/01/2012 22:40   Final   Culture     Final   Value:        BLOOD CULTURE RECEIVED NO GROWTH TO DATE CULTURE WILL BE HELD FOR 5 DAYS BEFORE ISSUING A FINAL NEGATIVE REPORT   Report Status PENDING   Incomplete  CULTURE, BLOOD (ROUTINE X 2)     Status: None   Collection Time    11/01/12  4:10 PM      Result Value Range Status   Specimen Description BLOOD R ARM   Final   Special Requests NONE BOTTLES DRAWN AEROBIC AND ANAEROBIC 5CC   Final   Culture  Setup Time 11/01/2012 22:40   Final   Culture     Final   Value:        BLOOD CULTURE RECEIVED NO GROWTH TO DATE CULTURE  WILL BE HELD FOR 5 DAYS BEFORE ISSUING A FINAL NEGATIVE REPORT   Report Status PENDING   Incomplete  MRSA PCR SCREENING     Status: None   Collection Time    11/01/12  5:56 PM      Result Value Range  Status   MRSA by PCR NEGATIVE  NEGATIVE Final   Comment:            The GeneXpert MRSA Assay (FDA     approved for NASAL specimens     only), is one component of a     comprehensive MRSA colonization     surveillance program. It is not     intended to diagnose MRSA     infection nor to guide or     monitor treatment for     MRSA infections.     Studies: Dg Hand Complete Left  11/02/2012   *RADIOLOGY REPORT*  Clinical Data: Burn injury 1 week ago.  Redness and swelling of the index finger.  LEFT HAND - COMPLETE 3+ VIEW  Comparison: None.  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  The DIP joint is intact of the middle finger.  There is erosion in the head of the middle phalanx in the index finger.  Diffuse soft tissue swelling is present about the index finger.  There is no significant gas.  No acute osseous abnormalities present.  Remote fractures are present in the fourth and fifth metacarpals. A marginal erosion is present at the base of the second metacarpal.  IMPRESSION:  1.  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 2. 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 3.  Inflammatory arthritis, also present, including gout or potentially psoriatic arthritis.   Original Report Authenticated By: Marin Roberts, M.D.    Scheduled Meds: . amitriptyline  25 mg Oral QHS  . digoxin  0.25 mg Oral Daily  . doxycycline  100 mg Oral Q12H  . gabapentin  600 mg Oral QID  . insulin aspart  0-9 Units Subcutaneous TID WC  . metoprolol tartrate  50 mg Oral BID  . nystatin  1 g Topical TID  . pantoprazole  40 mg Oral Daily   Continuous Infusions: . sodium chloride 75 mL (11/04/12 0100)  . heparin 800 Units/hr (11/04/12 0600)     Principal Problem:   Acute renal failure Active Problems:   Lactic acidosis   Tachycardia   Nausea & vomiting   Atrial flutter with RVR    Time spent: 35 minutes.     Aury Scollard  [facility] Pager [phone]. If 7PM-7AM, please contact night-coverage at [email or web address], password [facility] 11/04/2012, 8:10 AM  LOS: 3 days

## 2012-11-04 NOTE — Progress Notes (Signed)
Subjective:  No chest pain or SOB, HR 80s  Objective:  Vital Signs in the last 24 hours: Temp:  [97.9 F (36.6 C)-98.8 F (37.1 C)] 97.9 F (36.6 C) (06/30 0800) Pulse Rate:  [75-120] 80 (06/30 0600) Resp:  [11-27] 16 (06/30 0600) BP: (135-158)/(86-105) 158/105 mmHg (06/30 0413) SpO2:  [96 %-100 %] 100 % (06/30 0600) Weight:  [86.1 kg (189 lb 13.1 oz)-87.4 kg (192 lb 10.9 oz)] 86.1 kg (189 lb 13.1 oz) (06/30 0600)  Intake/Output from previous day:  Intake/Output Summary (Last 24 hours) at 11/04/12 0827 Last data filed at 11/04/12 0600  Gross per 24 hour  Intake   1204 ml  Output   2175 ml  Net   -971 ml    Physical Exam: General appearance: alert, cooperative, no distress and moderately obese Lungs: clear to auscultation bilaterally Heart: irregularly irregular rhythm   Rate: 82  Rhythm: atrial flutter  Lab Results:  Recent Labs  11/03/12 0401 11/04/12 0340  WBC 6.5 7.1  HGB 11.3* 11.6*  PLT 178 150    Recent Labs  11/03/12 0401 11/04/12 0340  NA 138 137  K 3.8 3.9  CL 103 104  CO2 26 24  GLUCOSE 205* 128*  BUN 13 8  CREATININE 0.92 0.78    Recent Labs  11/01/12 2230 11/02/12 0352  TROPONINI <0.30 <0.30   Hepatic Function Panel  Recent Labs  11/01/12 1150  PROT 7.7  ALBUMIN 4.2  AST 22  ALT 15  ALKPHOS 77  BILITOT 0.4   No results found for this basename: CHOL,  in the last 72 hours  Recent Labs  11/02/12 1316  INR 1.27    Imaging: Imaging results have been reviewed  Cardiac Studies: 2D- NL LVF, NL LA  Assessment/Plan:   Principal Problem:   Atrial flutter with RVR  Active Problems:   Acute renal failure on adm- now Nl   Lactic acidosis- resloved   Nausea & vomiting on adm- resolved   Type II NIDDM   Neuropathy in diabetes   Opioid dependence   HTN not controlled- ACE resumed, beta blocker is new,     PLAN: TEE/CV scheduled for 2pm with Dr Rennis Golden tomorrow. Check TSH. She may need further adjustment of her  medications for HTN prior to discharge.  Anticoagulation to be discussed with MD- she is a CHA2DS2-VASc  3. She will need cardiology follow up after discharge.   Corine Shelter PA-C Beeper [phone] 11/04/2012, 8:27 AM   Agree with note written by Corine Shelter [facility]  Remains in aflutter with CVR on IV hep. Exam benign. Plan TEE DCCV tomorrow with Dr. Rennis Golden. Dr. Rennis Golden to address long term oral anticoagulation.   Runell Gess 11/04/2012 1:15 PM

## 2012-11-04 NOTE — Consult Note (Signed)
WOC consult Note Reason for Consult:burned area on left hand, second digit. Wound type:thermal injury Pressure Ulcer POA: No Measurement:distal blister measuring .5cmn round is reabsorbing.  Proximal area has dried and crusted, leaving a 1.5cm x 1cm eschar covering 75% of injured area.   Wound ZOX:[id number] obscured by dry eschar Drainage (amount, consistency, odor) None Periwound:intact, dry Dressing procedure/placement/frequency:A hydrocolloid dressing is used to autolytically debride the dried eschar and reveal a moist, pink wound bed.  The dressing will also enhance reepithelialization and minimize scarring. I will not follow routinely, but will remain available to the medical and nursing teams until area is healed.  Please re-consult if needed. Thanks, Ladona Mow, MSN, RN, [facility], CWOCN [phone])

## 2012-11-04 NOTE — Progress Notes (Signed)
[id number]/Polk Minor, RN, BSN, CCM:  CHART REVIEWED AND UPDATED.  Next chart review due on [id number]. NO DISCHARGE NEEDS PRESENT AT THIS TIME. CASE MANAGEMENT [phone] 

## 2012-11-04 NOTE — Evaluation (Signed)
Occupational Therapy Evaluation Patient Details Name: Rebecca Brennan MRN: [id number] DOB: 1956-04-25 Today's Date: 11/04/2012 Time: 4098-1191 OT Time Calculation (min): 20 min  OT Assessment / Plan / Recommendation History of present illness Pt admitted for N/V , ARF, found to have aflutter. Pt was planning to go to facility for detox for opioids but became ill. Pt. going for TEE and ? cardioversion 11/05/12.Pt has h/o chronic back pain and sever neuropathy.   Clinical Impression   Pt presents to OT s/p admission to hospital with N/V.  Pt with decreased I with ADL activity due to overall weakness and decreased endurance    OT Assessment  Patient needs continued OT Services    Follow Up Recommendations  Supervision/Assistance - 24 hour;Home health OT       Equipment Recommendations  3 in 1 bedside comode       Frequency  Min 2X/week    Precautions / Restrictions Precautions Precaution Comments: incr. HR with min activity       ADL  Grooming: Performed;Wash/dry face;Set up Where Assessed - Grooming: Supported sitting Upper Body Bathing: Performed;Minimal assistance Where Assessed - Upper Body Bathing: Supported sitting;Unsupported sit to stand Lower Body Bathing: Performed;Minimal assistance Where Assessed - Lower Body Bathing: Unsupported sit to stand Upper Body Dressing: Performed;Minimal assistance Where Assessed - Upper Body Dressing: Unsupported sitting    OT Diagnosis: Generalized weakness  OT Problem List: Decreased strength;Decreased safety awareness;Decreased activity tolerance OT Treatment Interventions: Self-care/ADL training;Patient/family education   OT Goals(Current goals can be found in the care plan section) Acute Rehab OT Goals OT Goal Formulation: With patient Time For Goal Achievement: 11/11/12  Visit Information  Assistance Needed: +1 History of Present Illness: Pt admitted for N/V , ARF, found to have aflutter. Pt was planning to go to facility for  detox for opioids but became ill. Pt. going for TEE and ? cardioversion 11/05/12.Pt has h/o chronic back pain and sever neuropathy.       Prior Functioning     Home Living Family/patient expects to be discharged to:: Other (Comment) (plans  for psyche facility possibly.) Living Arrangements: Spouse/significant other Prior Function Level of Independence: Needs assistance Gait / Transfers Assistance Needed: uses cane and WC at times for cooking due to back pain. ADL's / Homemaking Assistance Needed: independent with bath using shower chair. Communication Communication: No difficulties         Vision/Perception Vision - Assessment Eye Alignment: Within Functional Limits   Cognition  Cognition Arousal/Alertness: Awake/alert Overall Cognitive Status: Within Functional Limits for tasks assessed    Extremity/Trunk Assessment Upper Extremity Assessment Upper Extremity Assessment: Overall WFL for tasks assessed Lower Extremity Assessment Lower Extremity Assessment: Overall WFL for tasks assessed     Mobility Transfers Transfers: Sit to Stand;Stand to Sit Sit to Stand: 4: Min assist;With upper extremity assist;From chair/3-in-1 Stand to Sit: 4: Min assist;With upper extremity assist;To chair/3-in-1           End of Session OT - End of Session Activity Tolerance: Patient tolerated treatment well Patient left: in chair;with call bell/phone within reach  GO     Donevan Biller, [place] Kung 11/04/2012, 2:50 PM

## 2012-11-04 NOTE — Evaluation (Addendum)
Physical Therapy Evaluation Patient Details Name: Rebecca Brennan MRN: [id number] DOB: 07/31/1955 Today's Date: 11/04/2012 Time: 6578-4696 PT Time Calculation (min): 26 min  PT Assessment / Plan / Recommendation History of Present Illness  Pt admitted for N/V , ARF, found to have aflutter. Pt was planning to go to facility for detox for opioids but became ill. Pt. going for TEE and ? cardioversion 11/05/12.Pt has h/o chronic back pain and sever neuropathy.  Clinical Impression  Pt is mobilizing well but limited due to HR. Pt has DME. If DC'd to home. Rec. HHPT. Will follow up on 11/06/12 after cardiac procedure as HR is limiting activity at present. Spouse to bring patients shoes and inserts for ambulating.  PT Assessment  Patient needs continued PT services    Follow Up Recommendations  Home health PT (if DC to home)    Does the patient have the potential to tolerate intense rehabilitation      Barriers to Discharge        Equipment Recommendations  None recommended by PT    Recommendations for Other Services     Frequency Min 3X/week    Precautions / Restrictions Precautions Precaution Comments: incr. HR with min activity Restrictions Weight Bearing Restrictions: No   Pertinent Vitals/Pain 92- 162. RN aware.      Mobility  Bed Mobility Bed Mobility: Not assessed Transfers Transfers: Sit to Stand;Stand to Sit Sit to Stand: 4: Min guard Stand to Sit: 4: Min guard Details for Transfer Assistance: at RW, pt moves slowly due to back pain. Ambulation/Gait Ambulation/Gait Assistance: 4: Min assist Ambulation Distance (Feet): 5 Feet Assistive device: Rolling walker Ambulation/Gait Assistance Details: HR increased to 160's so backed up to recliner and ceased activity. Gait Pattern: Step-through pattern    Exercises     PT Diagnosis: Generalized weakness;Difficulty walking;Acute pain  PT Problem List: Decreased activity tolerance;Cardiopulmonary status limiting activity PT  Treatment Interventions: DME instruction;Gait training;Functional mobility training;Therapeutic activities;Therapeutic exercise;Patient/family education     PT Goals(Current goals can be found in the care plan section) Acute Rehab PT Goals Patient Stated Goal: I want to have a better quality of life. PT Goal Formulation: With patient/family Time For Goal Achievement: 11/18/12 Potential to Achieve Goals: Good  Visit Information  Last PT Received On: 11/04/12 Assistance Needed: +1 History of Present Illness: Pt admitted for N/V , ARF, found to have aflutter. Pt was planning to go to facility for detox for opioids but became ill. Pt. going for TEE and ? cardioversion 11/05/12.Pt has h/o chronic back pain and sever neuropathy.       Prior Functioning  Home Living Family/patient expects to be discharged to:: Other (Comment) (plans  for psyche facility possibly.) Living Arrangements: Spouse/significant other Prior Function Level of Independence: Needs assistance Gait / Transfers Assistance Needed: uses cane and WC at times for cooking due to back pain. ADL's / Homemaking Assistance Needed: independent with bath using shower chair. Communication Communication: No difficulties    Cognition  Cognition Arousal/Alertness: Awake/alert Overall Cognitive Status: Within Functional Limits for tasks assessed    Extremity/Trunk Assessment Upper Extremity Assessment Upper Extremity Assessment: Defer to OT evaluation Lower Extremity Assessment Lower Extremity Assessment: RLE deficits/detail;LLE deficits/detail RLE Deficits / Details: pthas had multilple surgeries for Charcot deformities , grossly strength WFL in LE for mobility RLE Sensation: history of peripheral neuropathy LLE Deficits / Details: same as R LLE Sensation: history of peripheral neuropathy   Balance    End of Session PT - End of Session Activity  Tolerance: Treatment limited secondary to medical complications (Comment) (incr.  HR) Patient left: in chair;with call bell/phone within reach Nurse Communication: Mobility status  GP     Rada Hay 11/04/2012, 10:36 AM Blanchard Kelch PT [phone]

## 2012-11-05 ENCOUNTER — Encounter (HOSPITAL_COMMUNITY): Payer: Self-pay | Admitting: Gastroenterology

## 2012-11-05 ENCOUNTER — Inpatient Hospital Stay (HOSPITAL_COMMUNITY): Payer: 59 | Admitting: Anesthesiology

## 2012-11-05 ENCOUNTER — Encounter (HOSPITAL_COMMUNITY)
Admission: EM | Disposition: A | Discharge status: Home / Self Care | Payer: Self-pay | Source: Home / Self Care | Attending: Internal Medicine

## 2012-11-05 ENCOUNTER — Encounter (HOSPITAL_COMMUNITY): Payer: Self-pay | Admitting: Anesthesiology

## 2012-11-05 HISTORY — PX: CARDIOVERSION: SHX1299

## 2012-11-05 HISTORY — PX: TEE WITHOUT CARDIOVERSION: SHX5443

## 2012-11-05 LAB — GLUCOSE, CAPILLARY
Glucose-Capillary: 157 mg/dL — ABNORMAL HIGH (ref 70–99)
Glucose-Capillary: 153 mg/dL — ABNORMAL HIGH (ref 70–99)
Glucose-Capillary: 134 mg/dL — ABNORMAL HIGH (ref 70–99)

## 2012-11-05 LAB — BASIC METABOLIC PANEL
BUN: 12 mg/dL (ref 6–23)
Calcium: 7.7 mg/dL — ABNORMAL LOW (ref 8.4–10.5)
Chloride: 103 mEq/L (ref 96–112)
Creatinine, Ser: 0.89 mg/dL (ref 0.50–1.10)
GFR calc Af Amer: 81 mL/min — ABNORMAL LOW (ref >90)
GFR calc non Af Amer: 70 mL/min — ABNORMAL LOW (ref >90)

## 2012-11-05 LAB — CBC
MCH: 27.2 pg (ref 26.0–34.0)
MCHC: 31.8 g/dL (ref 30.0–36.0)
Platelets: 153 10*3/uL (ref 150–400)
RDW: 15.6 % — ABNORMAL HIGH (ref 11.5–15.5)

## 2012-11-05 LAB — HEPARIN LEVEL (UNFRACTIONATED): Heparin Unfractionated: 0.16 IU/mL — ABNORMAL LOW (ref 0.30–0.70)

## 2012-11-05 LAB — DIGOXIN LEVEL: Digoxin Level: 1.4 ng/mL (ref 0.8–2.0)

## 2012-11-05 SURGERY — ECHOCARDIOGRAM, TRANSESOPHAGEAL
Anesthesia: Monitor Anesthesia Care

## 2012-11-05 MED ORDER — RIVAROXABAN 20 MG PO TABS
20.0000 mg | ORAL_TABLET | Freq: Every day | ORAL | Status: DC
  Administered 2012-11-06: 20 mg via ORAL
  Filled 2012-11-05 (×2): qty 1

## 2012-11-05 MED ORDER — HEPARIN BOLUS VIA INFUSION
2000.0000 [IU] | INTRAVENOUS | Status: AC
  Administered 2012-11-05: 2000 [IU] via INTRAVENOUS
  Filled 2012-11-05: qty 2000

## 2012-11-05 MED ORDER — FENTANYL CITRATE 0.05 MG/ML IJ SOLN
INTRAMUSCULAR | Status: DC | PRN
  Administered 2012-11-05 (×3): 25 ug via INTRAVENOUS

## 2012-11-05 MED ORDER — HEPARIN (PORCINE) IN NACL 100-0.45 UNIT/ML-% IJ SOLN
1000.0000 [IU]/h | INTRAMUSCULAR | Status: AC
  Administered 2012-11-05: 1000 [IU]/h via INTRAVENOUS
  Filled 2012-11-05 (×2): qty 250

## 2012-11-05 MED ORDER — LIDOCAINE HCL (CARDIAC) 20 MG/ML IV SOLN
INTRAVENOUS | Status: DC | PRN
  Administered 2012-11-05: 30 mg via INTRAVENOUS

## 2012-11-05 MED ORDER — PROPOFOL 10 MG/ML IV BOLUS
INTRAVENOUS | Status: DC | PRN
  Administered 2012-11-05: 50 mg via INTRAVENOUS

## 2012-11-05 MED ORDER — LIDOCAINE VISCOUS 2 % MT SOLN
OROMUCOSAL | Status: DC | PRN
  Administered 2012-11-05: 10 mL via OROMUCOSAL

## 2012-11-05 MED ORDER — BUTAMBEN-TETRACAINE-BENZOCAINE 2-2-14 % EX AERO
INHALATION_SPRAY | CUTANEOUS | Status: DC | PRN
  Administered 2012-11-05: 1 via TOPICAL

## 2012-11-05 MED ORDER — MIDAZOLAM HCL 10 MG/2ML IJ SOLN
INTRAMUSCULAR | Status: DC | PRN
  Administered 2012-11-05 (×3): 2 mg via INTRAVENOUS
  Administered 2012-11-05: 1 mg via INTRAVENOUS

## 2012-11-05 NOTE — Anesthesia Preprocedure Evaluation (Addendum)
Anesthesia Evaluation  Patient identified by MRN, date of birth, ID band Patient awake    Reviewed: Allergy & Precautions, H&P , NPO status , Patient's Chart, lab work & pertinent test results, reviewed documented beta blocker date and time   History of Anesthesia Complications (+) PONV  Airway Mallampati: I TM Distance: >3 FB Neck ROM: Full    Dental   Pulmonary  breath sounds clear to auscultation        Cardiovascular hypertension, Pt. on medications + dysrhythmias Atrial Fibrillation Rhythm:Irregular Rate:Normal     Neuro/Psych    GI/Hepatic   Endo/Other  diabetes, Type 2  Renal/GU      Musculoskeletal   Abdominal (+) + obese,   Peds  Hematology   Anesthesia Other Findings   Reproductive/Obstetrics                          Anesthesia Physical Anesthesia Plan  ASA: III  Anesthesia Plan: General   Post-op Pain Management:    Induction: Intravenous  Airway Management Planned: Mask  Additional Equipment:   Intra-op Plan:   Post-operative Plan: Extubation in OR  Informed Consent: I have reviewed the patients History and Physical, chart, labs and discussed the procedure including the risks, benefits and alternatives for the proposed anesthesia with the patient or authorized representative who has indicated his/her understanding and acceptance.   Dental advisory given  Plan Discussed with: CRNA, Surgeon and Anesthesiologist  Anesthesia Plan Comments:        Anesthesia Quick Evaluation

## 2012-11-05 NOTE — Preoperative (Signed)
Beta Blockers   Reason not to administer Beta Blockers:Not Applicable 

## 2012-11-05 NOTE — Anesthesia Postprocedure Evaluation (Signed)
  Anesthesia Post-op Note  Patient: Rebecca Brennan  Procedure(s) Performed: Procedure(s): TRANSESOPHAGEAL ECHOCARDIOGRAM (TEE) (N/A) CARDIOVERSION (N/A)  Patient Location: PACU and Endoscopy Unit  Anesthesia Type:MAC  Level of Consciousness: awake  Airway and Oxygen Therapy: Patient Spontanous Breathing and Patient connected to nasal cannula oxygen  Post-op Pain: none  Post-op Assessment: Post-op Vital signs reviewed and Patient's Cardiovascular Status Stable  Post-op Vital Signs: Reviewed and stable  Complications: No apparent anesthesia complications

## 2012-11-05 NOTE — CV Procedure (Signed)
TEE/CARDIOVERSION NOTE  TRANSESOPHAGEAL ECHOCARDIOGRAM (TEE):  Indictation: Atrial Flutter  Consent:   Informed consent was obtained prior to the procedure. The risks, benefits and alternatives for the procedure were discussed and the patient comprehended these risks.  Risks include, but are not limited to, cough, sore throat, vomiting, nausea, somnolence, esophageal and stomach trauma or perforation, bleeding, low blood pressure, aspiration, pneumonia, infection, trauma to the teeth and death.    Time Out: Verified patient identification, verified procedure, site/side was marked, verified correct patient position, special equipment/implants available, medications/allergies/relevent history reviewed, required imaging and test results available. Performed  Procedure:  After a procedural time-out, the patient was given 7 mg versed and 75 mcg fentanyl for moderate sedation.  The oropharynx was anesthetized 10 cc of topical 1% viscous lidocaine and 1 spray of cetacaine.  The transesophageal probe was inserted in the esophagus and stomach without difficulty and multiple views were obtained.  The patient was kept under observation until the patient left the procedure room.  The patient left the procedure room in stable condition.   Agitated microbubble saline contrast was not administered, due to heavy upper airway secretions and cough, therefore the procedure was expedited for patient safety.  Complications:    Complications: None Patient did tolerate procedure well. There were copious secretions and strong cough toward the end of the procedure.  Findings:  1. LEFT VENTRICLE: The left ventricular wall thickness is mildly increased.  The left ventricular cavity is normal in size. Wall motion is normal.  LVEF is 55%.  2. RIGHT VENTRICLE:  The right ventricle is normal in structure and function without any thrombus or masses.    3. LEFT ATRIUM:  The left atrium is upper normal in size without  any thrombus or masses.  There is not spontaneous echo contrast ("smoke") in the left atrium consistent with a low flow state.  4. LEFT ATRIAL APPENDAGE:  The left atrial appendage is free of any thrombus or masses. The appendage has single lobes. Pulse doppler indicates moderate flow in the appendage with flutter waves which are appparent.  5. ATRIAL SEPTUM:  The atrial septum appears intact and is free of thrombus and/or masses.  There is no evidence for interatrial shunting by color doppler and saline microbubble.  6. RIGHT ATRIUM:  The right atrium is normal in size and function without any thrombus or masses.  7. MITRAL VALVE:  The mitral valve is normal in structure and function with Mild regurgitation.  There were no vegetations or stenosis.  8. AORTIC VALVE:  The aortic valve is normal in structure and function with no regurgitation.  There were no vegetations or stenosis  9. TRICUSPID VALVE:  The tricuspid valve is normal in structure and function with Mild regurgitation.  There were no vegetations or stenosis  10.  PULMONIC VALVE:  The pulmonic valve was not well visualized.   11. AORTIC ARCH, ASCENDING AND DESCENDING AORTA:  There was no significant atherosclerosis of the ascending aorta, aortic arch, or proximal descending aorta.  CARDIOVERSION:     Second Time Out: Verified patient identification, verified procedure, site/side was marked, verified correct patient position, special equipment/implants available, medications/allergies/relevent history reviewed, required imaging and test results available.  Performed  Procedure:  1. Patient placed on cardiac monitor, pulse oximetry, supplemental oxygen as necessary.  2. Sedation administered per anesthesia 3. Pacer pads placed anterior and posterior chest. 4. Cardioverted 1 time(s).  5. Cardioverted at 150Jbiphasic.  Successful cardioversion to sinus bradycardia.  Complications:  Complications: None  Patient did tolerate  procedure well.  Impression:  1. No LAA thrombus.   2. Preserved LV systolic function. 3. Successful cardioversion to sinus with 1 shock x 150J biphasic.  Recommendations:  1. Continue current medical therapy including digoxin. May have to reduce b-blocker dose if she remains bradycardic.  I would recommend transitioning from heparin to xarelto 20 mg daily tomorrow am per pharmacy.   Time Spent Directly with the Patient:  60 minutes   Chrystie Nose, MD, [facility] Attending Cardiologist [facility]  11/05/2012, 2:10 PM

## 2012-11-05 NOTE — Progress Notes (Signed)
  Echocardiogram Echocardiogram Transesophageal has been performed.  Rebecca Brennan 11/05/2012, 3:11 PM

## 2012-11-05 NOTE — Progress Notes (Signed)
ANTICOAGULATION CONSULT NOTE - Follow Up Consult  Pharmacy Consult for IV heparin Indication: atrial flutter  Allergies  Allergen Reactions  . Contrast Media (Iodinated Diagnostic Agents) Anaphylaxis    IVP-DYE (PATIENT STATES THAT SHE DIED ON THE OPERATING ROOM TABLE)  . Erythromycin Other (See Comments)    SEVERE YEAST INFECTION  . Tegretol (Carbamazepine) Other (See Comments)    Increased heart rate  . Morphine And Related Rash    Red line following the vein and itching in the affected area.    Labs:  Recent Labs  11/02/12 1316  11/03/12 0401  11/04/12 0340 11/05/12 0330 11/05/12 1135  HGB  --   < > 11.3*  --  11.6* 11.6*  --   HCT  --   --  36.4  --  36.7 36.5  --   PLT  --   --  178  --  150 153  --   LABPROT 15.6*  --   --   --   --   --   --   INR 1.27  --   --   --   --   --   --   HEPARINUNFRC  --   < >  --   < > 0.42 0.16* 0.40  CREATININE  --   --  0.92  --  0.78 0.89  --   < > = values in this interval not displayed.  Estimated Creatinine Clearance: 73.7 ml/min (by C-G formula based on Cr of 0.89).   Assessment: 25 yof presented 6/27 from [facility] for opiate withdrawal, N/V - found with ARF likely 2/2 dehydration, hyperkalemia, tachycardic, elevated lactate lvl. On 6/28, found with atrial flutter with RVR. MD ordered IV heparin plans for possible TEE DCCV in the future. Cardiology following  Heparin level is therapeutic on Heparin 1000 units/hr. No bleeding. CBC okay, Scr normalized. Plan TEE/DCCV at [facility] this afternoon.   Goal of Therapy:  Heparin level 0.3-0.7 units/ml Monitor platelets by anticoagulation protocol: Yes   Plan:   Continue IV heparin at 1000 units/hr  Daily heparin level and CBC   Pharmacy will f/u plans for anticoagulation after procedure  Geoffry Paradise, PharmD, BCPS Pager: [phone] 12:35 PM Pharmacy #: [id number]

## 2012-11-05 NOTE — Transfer of Care (Signed)
Immediate Anesthesia Transfer of Care Note  Patient: Rebecca Brennan  Procedure(s) Performed: Procedure(s): TRANSESOPHAGEAL ECHOCARDIOGRAM (TEE) (N/A) CARDIOVERSION (N/A)  Patient Location: PACU and Endoscopy Unit  Anesthesia Type:MAC  Level of Consciousness: awake  Airway & Oxygen Therapy: Patient Spontanous Breathing and Patient connected to nasal cannula oxygen  Post-op Assessment: Report given to PACU RN and Post -op Vital signs reviewed and stable  Post vital signs: Reviewed and stable  Complications: No apparent anesthesia complications

## 2012-11-05 NOTE — Progress Notes (Signed)
PHARMACY BRIEF NOTE: HEPARIN Indication:  Atrial Flutter  Heparin Level 0.16 on 800 units/hr Hgb 11.6 Hct  36.5 Pltc 153 K  RN confirms heparin rate, states there have been no interruptions, no bleeding noted. Plans for TEE/DCCV today at 2pm noted.  A:  Heparin level subtherapeutic P:  1. Heparin 2000 units IV bolus x 1  2. Increase Heparin infusion to 1000 units/hr  3. Recheck Heparin level at 11:30 AM  Elie Goody, PharmD, BCPS Pager: [phone] 11/05/2012  6:21 AM

## 2012-11-05 NOTE — Progress Notes (Signed)
[facility] PROGRESS NOTE  Rebecca Brennan ZOX:[id number] DOB: 1956-01-28 DOA: 11/01/2012 PCP: No primary provider on file.  Assessment/Plan:  1-Acute Renal failure;Resolved.  Probably pre renal, in setting dehydration, ACE.  Fena at 1.6, probably component of intrinsic renal diseases. Continue with IV fluids.  [place] negative for obstruction.  Cr peak to 4.8. Renal function improved. Cr has decrease to 0.9. Resolved.   2-Lactic acidosis/Sepsis: Resolved.  in setting of renal failure and metformin intake. IV fluids, stop metformin, blood culture no growth to date.  BP improved after IV fluids. [place] negative for cholecystitis. Lactic acid decrease to 1.2. Received 2 days of Zosyn. Urine culture with no growth. Doxy should cover for UTI.   3-Chronic pain syndrome/ Drug withdrawal:  Continue with  Vicodin to avoid worsening withdrawal symptoms.  Continue with  gabapentin to 600 mg 4 times a day. Patient was taking 800 mg of gabapentin 5 times a day. Psych consult appreciated. I would avoid NSAID for pain due to recent renal failure.  I spoke with son, he feels that Rebecca Brennan has being doing much better on reduce dose of pain medications. She is now more alert, able to carry a conversation. Rebecca Brennan and her family agree to follow up with pain clinic for further titration off opioids and others options for pain management. They agree that this is a long process. I recommend to follow up with a Rheumatogist for treatment of Psoriatic arthritis, psychiatrist and pain clinic. Patient will need PT at discharge. Family will compromise and they will provide pain medication to patient. I think we need to be cautious with completely stopping opioid in setting of heart problems at this time. Do not continue with flexeril and Rebecca contin at discharge.   4-Nausea/vomiting: Resolved.  This could be secondary to infection, maybe component of withdrawal. CT abdomen show thickening of the wall of the gallbladder fundus.  Abdominal US negative. Nausea and vomiting resolved. Tolerating diet. Change protonix to oral.   5- Atrial Flutter: Diffuse ST elevation, subsequently EKG A flutter. In setting of electrolytes abnormalities, Hypotension.  cardiac enzymes times 2 negative,  PRN lopressor. Cardiology consulted. ECHO with normal EF. On low dose schedule metoprolol. Heparin per cardio. Possible TEE / Cardioversion today.  Appreciate Cardio help. Started on digoxin 6-29. HR better controlled.   6-Hyperkalemia: Received Kayexalate, and calcium gluconate. Resolved.   7-UTI: unlikely UTI. Urine with no growth. Received 2 days of zosyn.   8-Focal thickening involving the wall of the gallbladder fundus. Abdominal US negative.    9-Diabetes: discontinue metformin due to renal failure. SSI.  10-Left 2 finger with ulcer: Patient burn her finger. Continue with Doxy 3/ 5 more days.  Wound care consulted. X ray show  arthritis.     Code Status: Full Family Communication: Care discussed with patient.  Disposition Plan: Remain inpatient. Transfer to telemetry when  [facility] by cardio.    Consultants:  [facility].   Procedures:  none  Antibiotics:  Zosyn 6-27-6-28  Doxy 6-28  HPI/Subjective: Feeling well. Denies chest pain or dyspnea. She has not need Rebecca contin. Long conversation with her son regarding pain management and future plans.   Objective: Filed Vitals:   11/05/12 0042 11/05/12 0400 11/05/12 0403 11/05/12 0800  BP: 157/90  161/97   Pulse: 80 78 79   Temp:  98 F (36.7 C)  98.1 F (36.7 C)  TempSrc:  Oral  Oral  Resp: 17 28 23    Height:  Weight:  87.4 kg (192 lb 10.9 oz)    SpO2: 100% 94% 100%     Intake/Output Summary (Last 24 hours) at 11/05/12 0821 Last data filed at 11/05/12 0615  Gross per 24 hour  Intake   1768 ml  Output   2775 ml  Net  -1007 ml   Filed Weights   11/04/12 0300 11/04/12 0600 11/05/12 0400  Weight: 87.4 kg (192 lb 10.9 oz) 86.1 kg (189 lb 13.1  oz) 87.4 kg (192 lb 10.9 oz)    Exam:   General:  No distress.   Cardiovascular: S 1, S 2 IRR  Respiratory: CTA  Abdomen: Bs present, soft, NT  Musculoskeletal: no edema. Left second finger with ulcer with dressing.  Data Reviewed: Basic Metabolic Panel:  Recent Labs Lab 11/01/12 1935 11/02/12 0352 11/03/12 0401 11/04/12 0340 11/05/12 0330  NA 137 136 138 137 136  K 4.4 3.3* 3.8 3.9 4.0  CL 96 98 103 104 103  CO2 28 27 26 24 25   GLUCOSE 103* 144* 205* 128* 263*  BUN 35* 30* 13 8 12   CREATININE 3.42* 2.33* 0.92 0.78 0.89  CALCIUM 7.4* 7.0* 7.5* 8.0* 7.7*   Liver Function Tests:  Recent Labs Lab 11/01/12 1150  AST 22  ALT 15  ALKPHOS 77  BILITOT 0.4  PROT 7.7  ALBUMIN 4.2    Recent Labs Lab 11/01/12 1150  LIPASE 17   No results found for this basename: AMMONIA,  in the last 168 hours CBC:  Recent Labs Lab 11/01/12 1150 11/02/12 0352 11/03/12 0401 11/04/12 0340 11/05/12 0330  WBC 9.9 6.6 6.5 7.1 6.4  NEUTROABS 6.1  --   --   --   --   HGB 13.4 11.6* 11.3* 11.6* 11.6*  HCT 41.3 37.5 36.4 36.7 36.5  MCV 83.8 84.1 83.9 85.0 85.5  PLT 263 167 178 150 153   Cardiac Enzymes:  Recent Labs Lab 11/01/12 1610 11/01/12 2230 11/02/12 0352  TROPONINI <0.30 <0.30 <0.30   BNP (last 3 results) No results found for this basename: PROBNP,  in the last 8760 hours CBG:  Recent Labs Lab 11/04/12 0801 11/04/12 1132 11/04/12 1739 11/04/12 2249 11/05/12 0744  GLUCAP 110* 213* 178* 229* 157*    Recent Results (from the past 240 hour(s))  URINE CULTURE     Status: None   Collection Time    11/01/12  1:29 PM      Result Value Range Status   Specimen Description URINE, CLEAN CATCH   Final   Special Requests NONE   Final   Culture  Setup Time 11/01/2012 21:48   Final   Colony Count NO GROWTH   Final   Culture NO GROWTH   Final   Report Status 11/02/2012 FINAL   Final  CULTURE, BLOOD (ROUTINE X 2)     Status: None   Collection Time    11/01/12   3:50 PM      Result Value Range Status   Specimen Description BLOOD L ARM   Final   Special Requests NONE BOTTLES DRAWN AEROBIC AND ANAEROBIC 4CC   Final   Culture  Setup Time 11/01/2012 22:40   Final   Culture     Final   Value:        BLOOD CULTURE RECEIVED NO GROWTH TO DATE CULTURE WILL BE HELD FOR 5 DAYS BEFORE ISSUING A FINAL NEGATIVE REPORT   Report Status PENDING   Incomplete  CULTURE, BLOOD (ROUTINE X 2)  Status: None   Collection Time    11/01/12  4:10 PM      Result Value Range Status   Specimen Description BLOOD R ARM   Final   Special Requests NONE BOTTLES DRAWN AEROBIC AND ANAEROBIC 5CC   Final   Culture  Setup Time 11/01/2012 22:40   Final   Culture     Final   Value:        BLOOD CULTURE RECEIVED NO GROWTH TO DATE CULTURE WILL BE HELD FOR 5 DAYS BEFORE ISSUING A FINAL NEGATIVE REPORT   Report Status PENDING   Incomplete  MRSA PCR SCREENING     Status: None   Collection Time    11/01/12  5:56 PM      Result Value Range Status   MRSA by PCR NEGATIVE  NEGATIVE Final   Comment:            The GeneXpert MRSA Assay (FDA     approved for NASAL specimens     only), is one component of a     comprehensive MRSA colonization     surveillance program. It is not     intended to diagnose MRSA     infection nor to guide or     monitor treatment for     MRSA infections.     Studies: No results found.  Scheduled Meds: . amitriptyline  25 mg Oral QHS  . digoxin  0.25 mg Oral Daily  . docusate sodium  100 mg Oral BID  . doxycycline  100 mg Oral Q12H  . gabapentin  600 mg Oral QID  . insulin aspart  0-9 Units Subcutaneous TID WC  . metoprolol tartrate  50 mg Oral BID  . nystatin  1 g Topical TID  . pantoprazole  40 mg Oral Daily   Continuous Infusions: . sodium chloride 75 mL (11/05/12 0646)  . heparin 1,000 Units/hr (11/05/12 0615)    Principal Problem:   Atrial flutter with RVR Active Problems:   Acute renal failure   Lactic acidosis   Nausea & vomiting    Type II NIDDM with neuropathy   Neuropathy in diabetes   Opioid dependence   HTN (hypertension)    Time spent: 35 minutes.     Elliott Lasecki  [facility] Pager [phone]. If 7PM-7AM, please contact night-coverage at [email or web address], password [facility] 11/05/2012, 8:21 AM  LOS: 4 days

## 2012-11-05 NOTE — H&P (Signed)
   INTERVAL PROCEDURE H&P  History and Physical Interval Note:  11/05/2012 12:16 PM  Rebecca Brennan has presented today for their planned procedure. The various methods of treatment have been discussed with the patient and family. After consideration of risks, benefits and other options for treatment, the patient has consented to the procedure.  The patients' outpatient history has been reviewed, patient examined, and no change in status from most recent office note within the past 30 days. I have reviewed the patients' chart and labs and will proceed as planned. Questions were answered to the patient's satisfaction.   Chrystie Nose, MD, [facility] Attending Cardiologist [facility]  Glorious Flicker C 11/05/2012, 12:16 PM

## 2012-11-05 NOTE — Progress Notes (Signed)
ANTICOAGULATION CONSULT NOTE - Follow Up  Pharmacy Consult: Transition from IV heparin to Xarelto tomorrow (7/2) am Indication: Atrial Flutter  Allergies  Allergen Reactions  . Contrast Media (Iodinated Diagnostic Agents) Anaphylaxis    IVP-DYE (PATIENT STATES THAT SHE DIED ON THE OPERATING ROOM TABLE)  . Erythromycin Other (See Comments)    SEVERE YEAST INFECTION  . Tegretol (Carbamazepine) Other (See Comments)    Increased heart rate  . Morphine And Related Rash    Red line following the vein and itching in the affected area.    Labs:  Recent Labs  11/03/12 0401  11/04/12 0340 11/05/12 0330 11/05/12 1135  HGB 11.3*  --  11.6* 11.6*  --   HCT 36.4  --  36.7 36.5  --   PLT 178  --  150 153  --   HEPARINUNFRC  --   < > 0.42 0.16* 0.40  CREATININE 0.92  --  0.78 0.89  --   < > = values in this interval not displayed.  Estimated Creatinine Clearance: 73.7 ml/min (by C-G formula based on Cr of 0.89).   Assessment: 57 yo F presented 6/27 from [facility] for opiate withdrawal, N/V - found with ARF likely 2/2 dehydration, hyperkalemia, tachycardic, and elevated lactate level. On 6/28, patient found to be atrial flutter with RVR. MD ordered IV heparin to start 6/28. Patient is now s/p TEE. Per cards, plan is to transition from IV heparin to PO Xarelto tomorrow (7/2) morning. Current CrCl > 50 ml/min.  For now, will continue current heparin infusion at  1000 units/hr  Tomorrow morning, will stop heparin and start Xarelto 20mg  daily with breakfast  The conversion should happen at the same time: stop heparin then immediately give the first dose of Xarelto. Xarelto should be given with a meal (breakfast)   Goal of Therapy:  Heparin level 0.3-0.7 units/ml Monitor platelets by anticoagulation protocol: Yes   Plan:   Continue IV heparin at 1000 units/hr  At 8am tomorrow (while patient eating breakfast), stop heparin infusion and give first dose of Xarelto 20mg   Continue  Xarelto 20mg  daily with breakfast thereafter  D/C daily HLs after tomorrow morning  Darrol Angel, PharmD Pager: [phone] 11/05/2012 3:38 PM

## 2012-11-06 ENCOUNTER — Encounter (HOSPITAL_COMMUNITY): Payer: Self-pay | Admitting: Internal Medicine

## 2012-11-06 DIAGNOSIS — F1120 Opioid dependence, uncomplicated: Secondary | ICD-10-CM

## 2012-11-06 DIAGNOSIS — Z7901 Long term (current) use of anticoagulants: Secondary | ICD-10-CM

## 2012-11-06 DIAGNOSIS — Z91041 Radiographic dye allergy status: Secondary | ICD-10-CM | POA: Diagnosis present

## 2012-11-06 DIAGNOSIS — R5381 Other malaise: Secondary | ICD-10-CM

## 2012-11-06 DIAGNOSIS — E1142 Type 2 diabetes mellitus with diabetic polyneuropathy: Secondary | ICD-10-CM

## 2012-11-06 LAB — CBC
MCH: 27.1 pg (ref 26.0–34.0)
MCV: 84.8 fL (ref 78.0–100.0)
Platelets: 155 10*3/uL (ref 150–400)
RDW: 15.9 % — ABNORMAL HIGH (ref 11.5–15.5)
WBC: 7.6 10*3/uL (ref 4.0–10.5)

## 2012-11-06 LAB — GLUCOSE, CAPILLARY
Glucose-Capillary: 197 mg/dL — ABNORMAL HIGH (ref 70–99)
Glucose-Capillary: 223 mg/dL — ABNORMAL HIGH (ref 70–99)

## 2012-11-06 MED ORDER — LISINOPRIL 5 MG PO TABS: 5.0000 mg | ORAL_TABLET | Freq: Every day | ORAL | Status: DC

## 2012-11-06 MED ORDER — DOXYCYCLINE HYCLATE 100 MG PO TABS: 100.0000 mg | ORAL_TABLET | Freq: Two times a day (BID) | ORAL | Status: AC

## 2012-11-06 MED ORDER — DIGOXIN 250 MCG PO TABS: 0.2500 mg | ORAL_TABLET | Freq: Every day | ORAL | Status: DC

## 2012-11-06 MED ORDER — RIVAROXABAN 20 MG PO TABS: 20.0000 mg | ORAL_TABLET | Freq: Every day | ORAL | Status: DC

## 2012-11-06 MED ORDER — METOPROLOL TARTRATE 50 MG PO TABS: 50.0000 mg | ORAL_TABLET | Freq: Two times a day (BID) | ORAL | Status: DC

## 2012-11-06 MED ORDER — METFORMIN HCL 1000 MG PO TABS: 1000.0000 mg | ORAL_TABLET | Freq: Two times a day (BID) | ORAL | Status: DC

## 2012-11-06 MED ORDER — HYDROCODONE-ACETAMINOPHEN 7.5-325 MG PO TABS: 1.0000 | ORAL_TABLET | ORAL | Status: DC | PRN

## 2012-11-06 MED ORDER — GABAPENTIN 300 MG PO CAPS: 600.0000 mg | ORAL_CAPSULE | Freq: Four times a day (QID) | ORAL | Status: DC

## 2012-11-06 NOTE — Progress Notes (Signed)
ANTICOAGULATION CONSULT NOTE - Follow Up Consult  Pharmacy Consult for heparin Indication: Atrial Flutter   Allergies  Allergen Reactions  . Contrast Media (Iodinated Diagnostic Agents) Anaphylaxis    IVP-DYE (PATIENT STATES THAT SHE DIED ON THE OPERATING ROOM TABLE)  . Erythromycin Other (See Comments)    SEVERE YEAST INFECTION  . Tegretol (Carbamazepine) Other (See Comments)    Increased heart rate  . Morphine And Related Rash    Red line following the vein and itching in the affected area.    Patient Measurements: Height: 5\' 4"  (162.6 cm) Weight: 192 lb 10.9 oz (87.4 kg) IBW/kg (Calculated) : 54.7 Heparin Dosing Weight:   Vital Signs: Temp: 98.7 F (37.1 C) (07/01 2000) Temp src: Oral (07/01 2000) BP: 124/86 mmHg (07/02 0600) Pulse Rate: 63 (07/02 0600)  Labs:  Recent Labs  11/04/12 0340 11/05/12 0330 11/05/12 1135 11/06/12 0345  HGB 11.6* 11.6*  --  11.8*  HCT 36.7 36.5  --  36.9  PLT 150 153  --  155  HEPARINUNFRC 0.42 0.16* 0.40 0.35  CREATININE 0.78 0.89  --   --     Estimated Creatinine Clearance: 73.7 ml/min (by C-G formula based on Cr of 0.89).   Medications:  Scheduled:  . amitriptyline  25 mg Oral QHS  . digoxin  0.25 mg Oral Daily  . docusate sodium  100 mg Oral BID  . doxycycline  100 mg Oral Q12H  . gabapentin  600 mg Oral QID  . insulin aspart  0-9 Units Subcutaneous TID WC  . metoprolol tartrate  50 mg Oral BID  . nystatin  1 g Topical TID  . pantoprazole  40 mg Oral Daily  . rivaroxaban  20 mg Oral Q breakfast   Infusions:  . sodium chloride 75 mL/hr at 11/05/12 1700  . heparin 1,000 Units/hr (11/05/12 1900)    Assessment: Patient with heparin level at goal.  Planned d/c heparin at 0800, start rivaroxaban at 0800.  RN made aware and noted no issues with drip.  Goal of Therapy:  Heparin level 0.3-0.7 units/ml Monitor platelets by anticoagulation protocol: Yes   Plan:  No changes, continue with planned change of  anticoagulation at 0800  Aleene Davidson Crowford 11/06/2012,6:12 AM

## 2012-11-06 NOTE — Progress Notes (Signed)
Subjective:  No complaints  Objective:  Vital Signs in the last 24 hours: Temp:  [97.8 F (36.6 C)-98.7 F (37.1 C)] 98.7 F (37.1 C) (07/01 2000) Pulse Rate:  [59-99] 85 (07/02 1051) Resp:  [8-67] 13 (07/02 0800) BP: (121-165)/(53-113) 165/94 mmHg (07/02 1051) SpO2:  [86 %-100 %] 100 % (07/02 0800)  Intake/Output from previous day:  Intake/Output Summary (Last 24 hours) at 11/06/12 1156 Last data filed at 11/06/12 0600  Gross per 24 hour  Intake   1445 ml  Output   1700 ml  Net   -255 ml    Physical Exam: General appearance: alert, cooperative, no distress and moderately obese Lungs: clear to auscultation bilaterally Heart: regular rate and rhythm   Rate: 80  Rhythm: normal sinus rhythm  Lab Results:  Recent Labs  11/05/12 0330 11/06/12 0345  WBC 6.4 7.6  HGB 11.6* 11.8*  PLT 153 155    Recent Labs  11/04/12 0340 11/05/12 0330  NA 137 136  K 3.9 4.0  CL 104 103  CO2 24 25  GLUCOSE 128* 263*  BUN 8 12  CREATININE 0.78 0.89    Imaging: Imaging results have been reviewed  Cardiac Studies:  Assessment/Plan:   Principal Problem:   Atrial flutter with RVR-TEE CV 11/05/12 Active Problems:   Type II NIDDM with neuropathy   HTN (hypertension)   Long term (current) use of anticoagulants- Xarelto started   Neuropathy in diabetes   Opioid dependence   Contrast media allergy    PLAN: Stop Lanoxin, continue Metoprolol and Xarelto. We will arrange follow up.  Corine Shelter PA-C Beeper [phone] 11/06/2012, 11:56 AM   Agree with note written by Corine Shelter [facility]  Successful TEE DCCV from Aflutter to NSR by Dr. Rennis Golden yesterday. Home on BB and Xarelto. ROV with Dr. Georgena Spurling 11/06/2012 12:31 PM

## 2012-11-06 NOTE — Progress Notes (Signed)
Physical Therapy Treatment Patient Details Name: SHANTEA POULTON MRN: [id number] DOB: [birth year] Today's Date: 11/06/2012 Time: 1250-1309 PT Time Calculation (min): 19 min  PT Assessment / Plan / Recommendation  PT Comments   Pt is progressing with gait and is ready to go home and continue strengthening and gait with HHPT  Follow Up Recommendations  Home health PT     Does the patient have the potential to tolerate intense rehabilitation     Barriers to Discharge        Equipment Recommendations  None recommended by PT    Recommendations for Other Services    Frequency Min 3X/week   Progress towards PT Goals Progress towards PT goals: Progressing toward goals  Plan Discharge plan needs to be updated    Precautions / Restrictions     Pertinent Vitals/Pain No c.o pain    Mobility  Bed Mobility Details for Bed Mobility Assistance: pt sitting up in chair Transfers Transfers: Sit to Stand;Stand to Sit Sit to Stand: 5: Supervision Stand to Sit: 5: Supervision Details for Transfer Assistance: pt able to repeat sit to stand x 3 times Ambulation/Gait Ambulation/Gait Assistance: 5: Supervision Ambulation Distance (Feet): 100 Feet Assistive device: Rolling walker Gait Pattern: Step-through pattern Gait velocity: decreased General Gait Details: pt is steady with use of RW Stairs: No Wheelchair Mobility Wheelchair Mobility: No    Exercises Other Exercises Other Exercises: repeated sit to stand Other Exercises: standing abd sets and glute sets Other Exercises: sitting and standing bilater UE hip to hip to activate core muscles   PT Diagnosis:    PT Problem List:   PT Treatment Interventions:     PT Goals (current goals can now be found in the care plan section) Acute Rehab PT Goals Patient Stated Goal: to go home today PT Goal Formulation: With patient/family  Visit Information  Last PT Received On: 11/06/12 History of Present Illness: Pt underwent TEE with successful  cardioversion 7/1    Subjective Data  Patient Stated Goal: to go home today   Cognition  Cognition Arousal/Alertness: Awake/alert Behavior During Therapy: [facility] for tasks assessed/performed    Balance  Balance Balance Assessed: Yes Static Sitting Balance Static Sitting - Balance Support: No upper extremity supported;Feet supported Static Sitting - Level of Assistance: 7: Independent Static Standing Balance Static Standing - Balance Support: Bilateral upper extremity supported;During functional activity Static Standing - Level of Assistance: 6: Modified independent (Device/Increase time)  End of Session PT - End of Session Patient left: in chair;with call bell/phone within reach Nurse Communication: Mobility status   GP     Donnetta Hail 11/06/2012, 1:52 PM

## 2012-11-06 NOTE — Progress Notes (Signed)
Pt had a nose bleed with a few drops of bright red blood on kleenex; denies any picking of nose. After 10 min nose bleed subsided. Dr. Gwenlyn Perking informed. Rebecca Brennan

## 2012-11-06 NOTE — Clinical Social Work Note (Signed)
CSW met with Pt and her son, Caryn Bee to reassess needs. CSW reviewed hand out of resources with Pt and her son. They are eager for a holistic approach to her concerns and want to follow up with a counselor, nutritionist and possible intensive outpt for her chronic pain issues. They plan to contact pain clinic as well and are now aware of how to access additional resources through Pt's insurance to assist with her concerns. No further CSW concerns noted.   RN CM aware of needs for [facility] and will set up.   No other CSW needs noted. CSW signing off.   Doreen Salvage, LCSW ICU/Stepdown Clinical Social Worker [facility] Cell [phone] Hours 8am-1200pm M-F

## 2012-11-06 NOTE — Discharge Summary (Addendum)
Physician Discharge Summary  ALMER LITTLETON ZOX:[id number] DOB: May 28, 1955 DOA: 11/01/2012  PCP: No primary provider on file.  Admit date: 11/01/2012 Discharge date: 11/06/2012  Time spent: >30 minutes  Recommendations for Outpatient Follow-up:  BMET to follow renal function and electrolytes CBC to follow Hgb (especially after been started on xarelto Reassess BP and adjust medications as needed Close follow up to CBG's and A1C; adjust oral hypoglycemic agents as needed; she might require a second agent for better control. (Metformin adjusted to 1000mg  BID, poor evidence of helping diabetes at higher doses and patient with lactic acidosis).  Discharge Diagnoses:  Principal Problem:   Atrial flutter with RVR Active Problems:   Acute renal failure   Lactic acidosis   Nausea & vomiting   Type II NIDDM with neuropathy   Neuropathy in diabetes   Opioid dependence   HTN (hypertension)   Discharge Condition: stable and improved. Will follow with PCP in 7-10 days.   Diet recommendation: low sodium and low carbohydrates diet.  Filed Weights   11/04/12 0300 11/04/12 0600 11/05/12 0400  Weight: 87.4 kg (192 lb 10.9 oz) 86.1 kg (189 lb 13.1 oz) 87.4 kg (192 lb 10.9 oz)    History of present illness:  57 y.o. female with PMH significant for opioid dependent, Diabetes, charcot foot who presents to ED complaining of nausea and vomiting since Wednesday. She has not been able to keep her medications down since that time. [facility] wanted to be detox from opioid, but they decline her and refer her to ED. She denies abdominal pain, chest pain, dyspnea, dysuria. She had some fever 3 days prior to admission. She saw her PCP 3 days ago because of nausea, she received a dose of phenergan. She is having diarrhea since she took the oral contrast here in the ed. She wanted to be detox from opioid because she is tired to be sleepy, and not functioning.   Hospital  Course:  1-Acute Renal failure;Resolved.  Probably pre renal, in setting dehydration, ACE. Fena at 1.6, probably component of intrinsic renal diseases. Continue with IV fluids. [place] negative for obstruction. Cr peak to 4.8. Renal function improved and Cr has decrease to WNL at discharge.   2-Lactic acidosis/Sepsis: Resolved.  in setting of renal failure and metformin intake. IV fluids and holding metformin, help resolving lactic acidosis. to date. BP improved after IV fluids. [place] negative for cholecystitis. Lactic acid decrease to 1.2. Received 2 days of Zosyn. Urine culture with no growth. Doxy should cover for any UTI  3-Chronic pain syndrome/ Drug withdrawal:  Continue with Vicodin to avoid worsening withdrawal symptoms. Continue with gabapentin to 600 mg 4 times a day. Patient was taking 800 mg of gabapentin 5 times a day. I would avoid NSAID for pain due to recent renal failure.  I spoke with son, he feels that Ms Friedl has being doing much better on reduce dose of pain medications. She is now more alert, able to carry a conversation. Ms Grabinski and her family agree to follow up with pain clinic/PCP for further titration off opioids and others non-narcotic options for pain management. They agree that this is a long process. I recommend to follow up with a Rheumatogist for treatment of Psoriatic arthritis, psychiatrist and pain clinic. Patient will need PT at discharge. Family will compromise and they will provide pain medication to patient.   4-Nausea/vomiting: Resolved.  This could be secondary to infection, or maybe component of withdrawal.  CT abdomen show thickening of the wall of the gallbladder fundus. Abdominal US negative. Nausea and vomiting resolved. At discharge Tolerating diet and medications w/o problems.  5- Atrial Flutter: Diffuse ST elevation, subsequently EKG showed Atrial flutter. In setting of electrolytes abnormalities and Hypotension. cardiac enzymes times 3 negative. Cardiology was  consulted and recommended TEE and cardioversion. After cardioversion HR and rhythm remained in sinus; rec's were to discharge on xarelto and metoprolol. Cardiology will arrange follow up visit after discharge.  6-Hyperkalemia: Received Kayexalate, and calcium gluconate. Resolved.   7-UTI: unlikely UTI. Urine with no growth. Received 2 days of zosyn and in the way to complete 7 total days of doxycycline.   8-Focal thickening involving the wall of the gallbladder fundus. Abdominal US negative.   9-Diabetes: will resume metformin. Dose adjusted as indicated by medication pharmacologic instructions and CBG's levels. Will need close follow up and further adjustments as an outpatient.  10-Left 2 finger with ulcer: Patient burned her finger. Continue with Doxycycline as instructed to finalize antibiotic coverage for her wound. No signs of superimposed infection.   11-GERD: continue PPI  12-HTN: well controlled on current medications. Lisinopril 5mg  daily and lopressor.   Procedures:  TEE 11/05/12 (preserved EF, no wall motion abnormalities and no LAA clots)  Cardioversion on 11/05/12  Consultations:  Cardiology (Dr. Rennis Golden)  Discharge Exam: Filed Vitals:   11/06/12 0400 11/06/12 0500 11/06/12 0600 11/06/12 0800  BP: 124/66  124/86 132/77  Pulse: 59 61 63 67  Temp:      TempSrc:      Resp: 21 23 18 13   Height:      Weight:      SpO2: 90% 100% 100% 100%    General: Afebrile, NAD, denies CP, SOB or any other acute complaint Cardiovascular: S1 and S2, no rubs or gallops; sinus seen on telemetry Respiratory: CTA bilaterally Abd: soft, NT, ND, positive BS Extremities: trace edema bilaterally; left index with Hydrocil dressing and blister in healing process from burning wound; no drainage. Neuro: no new focal deficit  Discharge Instructions  Discharge Orders   Future Orders Complete By Expires     Diet - low sodium heart healthy  As directed     Discharge instructions  As directed      Comments:      Keep yourself well hydrated Take medications as prescribed Follow a low carbohydrates diet Arrange follow up with Primary care physician in 7-10 days Please exchange the special bandage on your left index finger as instructed in 5 days; leave new bandage for another 5 days and after that time just apply and keep wounded area moist (using moisturizer creams, over the counter)    Face-to-face encounter (required for Medicare/Medicaid patients)  As directed     Comments:      I Davine Sweney certify that this patient is under my care and that I, or a nurse practitioner or physician's assistant working with me, had a face-to-face encounter that meets the physician face-to-face encounter requirements with this patient on 11/06/2012. The encounter with the patient was in whole, or in part for the following medical condition(s) which is the primary reason for home health care (List medical condition): physical deconditioning and chronic pain/neuropathy    Questions:      The encounter with the patient was in whole, or in part, for the following medical condition, which is the primary reason for home health care:  physical deconditioning and chronic pain/neuropathy    I certify that, based on  my findings, the following services are medically necessary home health services:  Physical therapy    My clinical findings support the need for the above services:  Unable to leave home safely without assistance and/or assistive device    Further, I certify that my clinical findings support that this patient is homebound due to:  Unable to leave home safely without assistance    Reason for Medically Necessary Home Health Services:  Therapy- Investment banker, operational, Patent examiner    Therapy- Therapeutic Exercises to Increase Strength and Endurance    Home Health  As directed     Questions:      To provide the following care/treatments:  PT    OT        Medication List    STOP taking  these medications       cyclobenzaprine 10 MG tablet  Commonly known as:  FLEXERIL     gabapentin 800 MG tablet  Commonly known as:  NEURONTIN  Replaced by:  gabapentin 300 MG capsule     morphine 60 MG 12 hr tablet  Commonly known as:  MS CONTIN      TAKE these medications       amitriptyline 25 MG tablet  Commonly known as:  ELAVIL  Take 25 mg by mouth at bedtime.     digoxin 0.25 MG tablet  Commonly known as:  LANOXIN  Take 1 tablet (0.25 mg total) by mouth daily.     doxycycline 100 MG tablet  Commonly known as:  VIBRA-TABS  Take 1 tablet (100 mg total) by mouth every 12 (twelve) hours.     estradiol 2 MG vaginal ring  Commonly known as:  ESTRING  Place 2 mg vaginally every 3 (three) months. follow package directions     gabapentin 300 MG capsule  Commonly known as:  NEURONTIN  Take 2 capsules (600 mg total) by mouth 4 (four) times daily.     HYDROcodone-acetaminophen 7.5-325 MG per tablet  Commonly known as:  NORCO  Take 1-2 tablets by mouth every 4 (four) hours as needed for pain.     lisinopril 5 MG tablet  Commonly known as:  PRINIVIL,ZESTRIL  Take 1 tablet (5 mg total) by mouth daily.     metFORMIN 1000 MG tablet  Commonly known as:  GLUCOPHAGE  Take 1 tablet (1,000 mg total) by mouth 2 (two) times daily with a meal.     metoprolol 50 MG tablet  Commonly known as:  LOPRESSOR  Take 1 tablet (50 mg total) by mouth 2 (two) times daily.     nystatin 100000 UNIT/GM Powd  Apply 1 g topically 3 (three) times daily.     omeprazole 20 MG capsule  Commonly known as:  PRILOSEC  Take 20 mg by mouth every morning.     ondansetron 8 MG disintegrating tablet  Commonly known as:  ZOFRAN-ODT  Take 8 mg by mouth every 8 (eight) hours as needed for nausea.     Rivaroxaban 20 MG Tabs  Commonly known as:  XARELTO  Take 1 tablet (20 mg total) by mouth daily with breakfast.       Allergies  Allergen Reactions  . Contrast Media (Iodinated Diagnostic Agents)  Anaphylaxis    IVP-DYE (PATIENT STATES THAT SHE DIED ON THE OPERATING ROOM TABLE)  . Erythromycin Other (See Comments)    SEVERE YEAST INFECTION  . Tegretol (Carbamazepine) Other (See Comments)    Increased heart rate  . Morphine And Related Rash  Red line following the vein and itching in the affected area.       Follow-up Information   Follow up with Estanislado Pandy, MD. Schedule an appointment as soon as possible for a visit in 7 days.   Contact information:   [address] [place] [address] [phone]        The results of significant diagnostics from this hospitalization (including imaging, microbiology, ancillary and laboratory) are listed below for reference.    Significant Diagnostic Studies: Ct Abdomen Pelvis Wo Contrast  11/01/2012   *RADIOLOGY REPORT*  Clinical Data: Abdominal pain.  Nausea and vomiting.  Surgical history includes appendectomy, cesarean section, and pessary. Renal insufficiency and prior anaphylactic reaction to iodinated contrast precluded IV contrast administration.  CT ABDOMEN AND PELVIS WITHOUT CONTRAST  Technique:  Multidetector CT imaging of the abdomen and pelvis was performed following the standard protocol without intravenous contrast.  Oral contrast was administered.  Comparison: Report of CT abdomen and pelvis 11/25/2010 from [facility], describing pericardial fluid but no acute findings in the abdomen or pelvis.  The images themselves are not currently available for direct comparison.  Findings: Calcified granulomata in the liver which is otherwise unremarkable for the unenhanced technique.  Normal unenhanced appearance of the spleen and adrenal glands.  Lobular contour to both kidneys consistent with fetal lobulations.  Very small (2 mm) calculus in a lower pole calix of the left kidney without obstruction.  No urinary tract calculi elsewhere on either side. Severe diffuse pancreatic atrophy.  Focal wall thickening involving the  independent portion of the gallbladder fundus.  No calcified gallstones.  No biliary ductal dilation.  Mild to moderate aorto- iliofemoral atherosclerosis.  No significant lymphadenopathy.  Oral contrast material within the distal esophagus.  No evidence of hiatal hernia.  Stomach normal in appearance.  Normal-appearing small bowel.  Apparent wall thickening involving the descending colon, sigmoid colon, and rectum is felt to be due to the fact that these portions of the colon are decompressed, as there is no pericolonic inflammation.  Appendix surgically absent.  No ascites.  Pessary device in the pelvis.  Urinary bladder decompressed. Uterus mildly atrophic consistent with age.  Ovaries normal by CT. No free pelvic fluid.  Numerous pelvic phleboliths.  Bone window images demonstrate severe degenerative changes involving the lower thoracic and lumbar spine.  Visualized lung bases clear.  Heart size normal.  No evidence of pericardial effusion, though there may be mild pericardial thickening.  IMPRESSION:  1.  No acute abnormalities involving the abdomen or pelvis. 2.  Severe pancreatic atrophy. 3.  Focal thickening involving the wall of the gallbladder fundus. Non-emergent right upper quadrant abdominal ultrasound is suggested to exclude a gallbladder tumor. 4.  Non-obstructing 2 mm left lower pole renal calculus.  No urinary tract calculi elsewhere on either side. 5.  Oral contrast material in the distal esophagus without evidence of hiatal hernia.  Query GE reflux and/or esophageal dysmotility. 6.  Mild pericardial thickening without evidence of pericardial effusion.   Original Report Authenticated By: Hulan Saas, M.D.   US Abdomen Complete  11/01/2012   *RADIOLOGY REPORT*  Clinical Data:  Nausea and vomiting.  Renal insufficiency.  ABDOMEN ULTRASOUND  Technique:  Complete abdominal ultrasound examination was performed including evaluation of the liver, gallbladder, bile ducts, pancreas, kidneys, spleen,  IVC, and abdominal aorta.  Comparison:  CT of the abdomen and pelvis earlier today.  Findings:  Gallbladder:  No shadowing gallstones or echogenic sludge.  No gallbladder wall  thickening or pericholecystic fluid.  Negative sonographic Murphy's sign according to the ultrasound technologist. No gallbladder mass is seen at the level of the question focal thickening by CT.  Common Bile Duct:  Normal caliber of 3 mm.  Liver:  No focal mass lesion seen.  Within normal limits in parenchymal echogenicity.  IVC:  Patent throughout its visualized course in the abdomen.  Pancreas:  Although the pancreas is difficult to visualize in its entirety, no focal pancreatic abnormality is identified.  Spleen:  The spleen is of normal echotexture and size.  Kidneys:  The right kidney measures approximately 13.1 cm and the left kidney 12.5 cm.  Both kidneys show no evidence of hydronephrosis.  There is suggestion of mild cortical thinning on the right.  No focal renal masses are identified.  Abdominal Aorta:  Normal caliber abdominal aorta.  IMPRESSION: Unremarkable abdominal ultrasound.   Original Report Authenticated By: Irish Lack, M.D.   Dg Chest Portable 1 View  11/01/2012   *RADIOLOGY REPORT*  Clinical Data: Diabetes, nonsmoker  PORTABLE CHEST - 1 VIEW  Comparison: Portable exam 1222 hours without priors for comparison  Findings: Upper normal heart size. Mediastinal contours and pulmonary vascularity normal. Minimal bronchitic changes with atelectasis or scarring lingula. Lungs otherwise clear. No pleural effusion or pneumothorax. Bones unremarkable.  IMPRESSION: Minimal bronchitic changes with lingular atelectasis versus scarring. No additional abnormalities.   Original Report Authenticated By: Ulyses Southward, M.D.   Dg Hand Complete Left  11/02/2012   *RADIOLOGY REPORT*  Clinical Data: Burn injury 1 week ago.  Redness and swelling of the index finger.  LEFT HAND - COMPLETE 3+ VIEW  Comparison: None.  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  The DIP joint is intact of the middle finger.  There is erosion in the head of the middle phalanx in the index finger.  Diffuse soft tissue swelling is present about the index finger.  There is no significant gas.  No acute osseous abnormalities present.  Remote fractures are present in the fourth and fifth metacarpals. A marginal erosion is present at the base of the second metacarpal.  IMPRESSION:  1.  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 2. 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 3.  Inflammatory arthritis, also present, including gout or potentially psoriatic arthritis.   Original Report Authenticated By: Marin Roberts, M.D.    Microbiology: Recent Results (from the past 240 hour(s))  URINE CULTURE     Status: None   Collection Time    11/01/12  1:29 PM      Result Value Range Status   Specimen Description URINE, CLEAN CATCH   Final   Special Requests NONE   Final   Culture  Setup Time 11/01/2012 21:48   Final   Colony Count NO GROWTH   Final   Culture NO GROWTH   Final   Report Status 11/02/2012 FINAL   Final  CULTURE, BLOOD (ROUTINE X 2)     Status: None   Collection Time    11/01/12  3:50 PM      Result Value Range Status   Specimen Description BLOOD L ARM   Final   Special Requests NONE BOTTLES DRAWN AEROBIC AND ANAEROBIC 4CC   Final   Culture  Setup Time 11/01/2012 22:40   Final   Culture     Final   Value:        BLOOD CULTURE RECEIVED NO GROWTH TO DATE CULTURE  WILL BE HELD FOR 5 DAYS BEFORE ISSUING A FINAL NEGATIVE REPORT   Report Status PENDING   Incomplete  CULTURE, BLOOD (ROUTINE X 2)     Status: None   Collection Time    11/01/12  4:10 PM      Result Value Range Status   Specimen Description BLOOD R ARM   Final   Special Requests NONE BOTTLES DRAWN AEROBIC AND ANAEROBIC 5CC   Final   Culture  Setup Time 11/01/2012 22:40   Final   Culture     Final   Value:         BLOOD CULTURE RECEIVED NO GROWTH TO DATE CULTURE WILL BE HELD FOR 5 DAYS BEFORE ISSUING A FINAL NEGATIVE REPORT   Report Status PENDING   Incomplete  MRSA PCR SCREENING     Status: None   Collection Time    11/01/12  5:56 PM      Result Value Range Status   MRSA by PCR NEGATIVE  NEGATIVE Final   Comment:            The GeneXpert MRSA Assay (FDA     approved for NASAL specimens     only), is one component of a     comprehensive MRSA colonization     surveillance program. It is not     intended to diagnose MRSA     infection nor to guide or     monitor treatment for     MRSA infections.     Labs: Basic Metabolic Panel:  Recent Labs Lab 11/01/12 1935 11/02/12 0352 11/03/12 0401 11/04/12 0340 11/05/12 0330  NA 137 136 138 137 136  K 4.4 3.3* 3.8 3.9 4.0  CL 96 98 103 104 103  CO2 28 27 26 24 25   GLUCOSE 103* 144* 205* 128* 263*  BUN 35* 30* 13 8 12   CREATININE 3.42* 2.33* 0.92 0.78 0.89  CALCIUM 7.4* 7.0* 7.5* 8.0* 7.7*   Liver Function Tests:  Recent Labs Lab 11/01/12 1150  AST 22  ALT 15  ALKPHOS 77  BILITOT 0.4  PROT 7.7  ALBUMIN 4.2    Recent Labs Lab 11/01/12 1150  LIPASE 17   CBC:  Recent Labs Lab 11/01/12 1150 11/02/12 0352 11/03/12 0401 11/04/12 0340 11/05/12 0330 11/06/12 0345  WBC 9.9 6.6 6.5 7.1 6.4 7.6  NEUTROABS 6.1  --   --   --   --   --   HGB 13.4 11.6* 11.3* 11.6* 11.6* 11.8*  HCT 41.3 37.5 36.4 36.7 36.5 36.9  MCV 83.8 84.1 83.9 85.0 85.5 84.8  PLT 263 167 178 150 153 155   Cardiac Enzymes:  Recent Labs Lab 11/01/12 1610 11/01/12 2230 11/02/12 0352  TROPONINI <0.30 <0.30 <0.30   CBG:  Recent Labs Lab 11/05/12 0744 11/05/12 1205 11/05/12 1735 11/05/12 2147 11/06/12 0802  GLUCAP 157* 153* 134* 197* 176*    Signed:  Alysa Duca  [facility] 11/06/2012, 10:15 AM

## 2012-11-07 LAB — CULTURE, BLOOD (ROUTINE X 2): Culture: NO GROWTH

## 2012-11-11 ENCOUNTER — Telehealth (HOSPITAL_COMMUNITY): Payer: Self-pay | Admitting: Dietician

## 2012-11-11 NOTE — Telephone Encounter (Signed)
Received voicemail from [place], Referrals at [facility], left at 1046. Pt is in need of diabetes education.

## 2012-11-11 NOTE — Telephone Encounter (Signed)
Called back at 1345. Unable to reach representative and unable to leave voicemail. Will reattempt.

## 2012-11-11 NOTE — Telephone Encounter (Signed)
Called again at 1606. Ann to fax medical records and pt demographic information so this RD can schedule appointment.

## 2012-11-12 NOTE — Telephone Encounter (Signed)
Called pt at 1105. Appointment scheduled for 12/02/12 at 1000.

## 2012-11-20 ENCOUNTER — Encounter: Payer: Self-pay | Admitting: Internal Medicine

## 2012-11-20 ENCOUNTER — Ambulatory Visit (INDEPENDENT_AMBULATORY_CARE_PROVIDER_SITE_OTHER): Payer: 59 | Admitting: Internal Medicine

## 2012-11-20 VITALS — BP 128/74 | HR 66 | Ht 62.0 in | Wt 186.1 lb

## 2012-11-20 DIAGNOSIS — I4892 Unspecified atrial flutter: Secondary | ICD-10-CM

## 2012-11-20 DIAGNOSIS — F1120 Opioid dependence, uncomplicated: Secondary | ICD-10-CM

## 2012-11-20 DIAGNOSIS — I10 Essential (primary) hypertension: Secondary | ICD-10-CM

## 2012-11-20 DIAGNOSIS — G4700 Insomnia, unspecified: Secondary | ICD-10-CM

## 2012-11-20 DIAGNOSIS — E1149 Type 2 diabetes mellitus with other diabetic neurological complication: Secondary | ICD-10-CM

## 2012-11-20 MED ORDER — METOPROLOL TARTRATE 50 MG PO TABS: 50.0000 mg | ORAL_TABLET | Freq: Two times a day (BID) | ORAL | Status: DC

## 2012-11-20 MED ORDER — RIVAROXABAN 20 MG PO TABS: 20.0000 mg | ORAL_TABLET | Freq: Every day | ORAL | Status: DC

## 2012-11-20 NOTE — Progress Notes (Signed)
OFFICE NOTE  Chief Complaint:  Hospital followup, insomnia  Primary Care Physician: Rebecca Pandy, MD  HPI:  Rebecca Brennan is a pleasant 57 year old female with chronic back pain on narcotics, and was recently admitted in the hospital with a urinary tract infection and was profoundly dehydrated. She is in renal failure and developed atrial flutter with rapid ventricular response. We were consult it and had difficulty with rate control, ultimately adding Lopressor and digoxin. I recommended a TEE-guided cardioversion for rate control. This was successfully performed and she converted to sinus rhythm. We had discontinued her digoxin but kept her on metoprolol and a Xarelto 20 mg daily for stroke prevention. Her CHADS2 score is 2 (DM2, HTN), despite her age being less than 6. This does put her at an increased stroke risk of at least 5% annually. The other concerning issue is that she was unaware of her atrial fibrillation, and may not be able to reliably indicate when she is in and out of atrial fibrillation.  Overall she reports feeling well except for she has had significant insomnia. She says that she's had less than a few hours of sleep over the past several weeks. Her diabetes control is still suboptimal, and this is most likely why she had developed significant neuropathy. Blood pressure and heart rate are at goal today. Her EKG does show persistent sinus rhythm at a rate of 66. She denies any chest pain, worsening shortness of breath, palpitations, presyncope or syncope, orthopnea or weight gain.  PMHx:  Past Medical History  Diagnosis Date  . Diabetes mellitus without complication   . Arthritis   . Neuropathy   . Charcot foot due to diabetes mellitus   . PONV (postoperative nausea and vomiting)   . Hypertension   . Dysrhythmia     afib/flutter    Past Surgical History  Procedure Laterality Date  . Cesarean section  1978  . Appendectomy    . Foot surgery    . Hand surgery     carpal tunnel left, trigger finger right thumb,middle finger,  . Tee without cardioversion N/A 11/05/2012    Procedure: TRANSESOPHAGEAL ECHOCARDIOGRAM (TEE);  Surgeon: Rebecca Nose, MD;  Location: [facility] ENDOSCOPY;  Service: Cardiovascular;  Laterality: N/A;  . Cardioversion N/A 11/05/2012    Procedure: CARDIOVERSION;  Surgeon: Rebecca Nose, MD;  Location: [facility] ENDOSCOPY;  Service: Cardiovascular;  Laterality: N/A;    FAMHx:  Family History  Problem Relation Age of Onset  . Arthritis Mother   . Diabetes Father   . Healthy Sister   . Healthy Sister   . Healthy Sister   . Healthy Child     SOCHx:   reports that she has never smoked. She has never used smokeless tobacco. She reports that she does not drink alcohol or use illicit drugs.  ALLERGIES:  Allergies  Allergen Reactions  . Contrast Media (Iodinated Diagnostic Agents) Anaphylaxis    IVP-DYE (PATIENT STATES THAT SHE DIED ON THE OPERATING ROOM TABLE)  . Erythromycin Other (See Comments)    SEVERE YEAST INFECTION  . Tegretol (Carbamazepine) Other (See Comments)    Increased heart rate  . Morphine And Related Rash    Red line following the vein and itching in the affected area.    ROS: A comprehensive review of systems was negative except for: Constitutional: positive for fatigue and weight loss Respiratory: positive for dyspnea on exertion Musculoskeletal: positive for back pain, myalgias and neuropathy  HOME MEDS: Current Outpatient Prescriptions  Medication Sig Dispense  Refill  . Blood Glucose Monitoring Suppl (ACCU-CHEK AVIVA PLUS) W/DEVICE KIT 1 each by Other route 3 (three) times daily.      Marland Kitchen estradiol (ESTRING) 2 MG vaginal ring Place 2 mg vaginally every 3 (three) months. follow package directions      . gabapentin (NEURONTIN) 300 MG capsule Take 2 capsules (600 mg total) by mouth 4 (four) times daily.  240 capsule  0  . glucose blood (ACCU-CHEK AVIVA PLUS) test strip 1 each by Other route 3 (three) times daily.       Marland Kitchen HYDROcodone-acetaminophen (NORCO) 7.5-325 MG per tablet Take 1-2 tablets by mouth every 4 (four) hours as needed for pain.  30 tablet  0  . lisinopril (PRINIVIL,ZESTRIL) 5 MG tablet Take 1 tablet (5 mg total) by mouth daily.  30 tablet  1  . metFORMIN (GLUCOPHAGE) 1000 MG tablet Take 1 tablet (1,000 mg total) by mouth 2 (two) times daily with a meal.  60 tablet  1  . metoprolol (LOPRESSOR) 50 MG tablet Take 1 tablet (50 mg total) by mouth 2 (two) times daily.  60 tablet  1  . nystatin (MYCOSTATIN/NYSTOP) 100000 UNIT/GM POWD Apply 1 g topically 3 (three) times daily.      Marland Kitchen omeprazole (PRILOSEC) 20 MG capsule Take 20 mg by mouth every morning.      . ondansetron (ZOFRAN-ODT) 8 MG disintegrating tablet Take 8 mg by mouth every 8 (eight) hours as needed for nausea.      . Rivaroxaban (XARELTO) 20 MG TABS Take 1 tablet (20 mg total) by mouth daily with breakfast.  30 tablet  0   No current facility-administered medications for this visit.    LABS/IMAGING: No results found for this or any previous visit (from the past 48 hour(s)). No results found.  VITALS: BP 128/74  Pulse 66  Ht 5\' 2"  (1.575 m)  Wt 186 lb 1.6 oz (84.414 kg)  BMI 34.03 kg/m2  EXAM: General appearance: fatigued and no distress Neck: no adenopathy, no carotid bruit, no JVD, supple, symmetrical, trachea midline and thyroid not enlarged, symmetric, no tenderness/mass/nodules Lungs: clear to auscultation bilaterally Heart: regular rate and rhythm, S1, S2 normal, no murmur, click, rub or gallop Abdomen: soft, non-tender; bowel sounds normal; no masses,  no organomegaly Extremities: extremities normal, atraumatic, no cyanosis or edema Pulses: 2+ and symmetric Skin: Skin color, texture, turgor normal. No rashes or lesions Neurologic: Grossly normal  EKG: Normal sinus rhythm at 66  ASSESSMENT: 1. Paroxysmal atrial fibrillation, now in sinus on Xarelto 2. Hypertension 3. Diabetes type 2 4. Morbid obesity 5. Chronic  pain 6. Neuropathy  PLAN: 1.   Rebecca Brennan is stable and maintaining a sinus rhythm after TEE guided cardioversion. She is tolerating Xarelto without significant bleeding problems. Typically in a young person with isolated atrial fibrillation or flutter but may be related to renal failure and/or infection, I would recommend 6 months of anticoagulation. However, given her risk factors for stroke which include diabetes that is not optimally controlled, hypertension, morbid obesity and possibly undiagnosed sleep apnea, I would recommend her to to stay on anticoagulation for life. We did discuss the possibility of sleep apnea, possibly related to medications and her weight. She denies any significant apnea but has been told by her husband that she snores. Her main issue now is insomnia for which she may benefit from a short course of sleep medication such as Ambien. Her hypertension appears well-controlled and I would keep her on her current dose of  metoprolol. Plan is to see her back in the office annually or sooner if necessary.  Rebecca Nose, MD, [facility] Attending Cardiologist [facility]  Jeriah Skufca C 11/20/2012, 12:37 PM

## 2012-11-20 NOTE — Patient Instructions (Addendum)
Your physician recommends that you schedule a follow-up appointment in: 1 year  

## 2012-12-02 ENCOUNTER — Encounter (HOSPITAL_COMMUNITY): Payer: Self-pay | Admitting: Dietician

## 2012-12-02 NOTE — Progress Notes (Signed)
Outpatient Initial Nutrition Assessment  Date:12/02/2012   Appt Start Time: 0957  Referring Physician: [facility] Reason for Visit: diabetes  Nutrition Assessment:  Height: 5' 1.5" (156.2 cm)   Weight: 178 lb (80.74 kg)   IBW: 108# %IBW: 165% UBW: 200# %UBW: 89% Body mass index is 33.09 kg/(m^2). Meets criteria for obesity, class I. Goal Weight: 160# (10% loss of current weight) Weight hx: Pt reports UBW  "a little below 200#". Pt husband reports that pt wt was 200# (-22# (11%) loss) x 1 month. Pt and husband reports wt loss has been intentional, due to lifestyle changes.   Estimated nutritional needs:  Kcals/ day: 1300-1400 Protein (grams)/day: 65-81 Fluid (L)/ day: 1.3-1.4  PMH:  Past Medical History  Diagnosis Date  . Diabetes mellitus without complication   . Arthritis   . Neuropathy   . Charcot foot due to diabetes mellitus   . PONV (postoperative nausea and vomiting)   . Hypertension   . Dysrhythmia     afib/flutter    Medications:  Current Outpatient Rx  Name  Route  Sig  Dispense  Refill  . Blood Glucose Monitoring Suppl (ACCU-CHEK AVIVA PLUS) W/DEVICE KIT   Other   1 each by Other route 3 (three) times daily.         Marland Kitchen glucose blood (ACCU-CHEK AVIVA PLUS) test strip   Other   1 each by Other route 3 (three) times daily.         Marland Kitchen HYDROcodone-acetaminophen (NORCO) 7.5-325 MG per tablet   Oral   Take 1-2 tablets by mouth every 4 (four) hours as needed for pain.   30 tablet   0   . lisinopril (PRINIVIL,ZESTRIL) 5 MG tablet   Oral   Take 1 tablet (5 mg total) by mouth daily.   30 tablet   1   . metFORMIN (GLUCOPHAGE) 1000 MG tablet   Oral   Take 1 tablet (1,000 mg total) by mouth 2 (two) times daily with a meal.   60 tablet   1   . metoprolol (LOPRESSOR) 50 MG tablet   Oral   Take 1 tablet (50 mg total) by mouth 2 (two) times daily.   180 tablet   3   . omeprazole (PRILOSEC) 20 MG capsule   Oral   Take 20 mg by mouth every  morning.         . Rivaroxaban (XARELTO) 20 MG TABS   Oral   Take 1 tablet (20 mg total) by mouth daily with breakfast.   90 tablet   3   . estradiol (ESTRING) 2 MG vaginal ring   Vaginal   Place 2 mg vaginally every 3 (three) months. follow package directions         . gabapentin (NEURONTIN) 300 MG capsule   Oral   Take 2 capsules (600 mg total) by mouth 4 (four) times daily.   240 capsule   0   . nystatin (MYCOSTATIN/NYSTOP) 100000 UNIT/GM POWD   Topical   Apply 1 g topically 3 (three) times daily.         . ondansetron (ZOFRAN-ODT) 8 MG disintegrating tablet   Oral   Take 8 mg by mouth every 8 (eight) hours as needed for nausea.           Labs: CMP     Component Value Date/Time   NA 136 11/05/2012 0330   K 4.0 11/05/2012 0330   CL 103 11/05/2012 0330   CO2 25 11/05/2012  0330   GLUCOSE 263* 11/05/2012 0330   BUN 12 11/05/2012 0330   CREATININE 0.89 11/05/2012 0330   CALCIUM 7.7* 11/05/2012 0330   PROT 7.7 11/01/2012 1150   ALBUMIN 4.2 11/01/2012 1150   AST 22 11/01/2012 1150   ALT 15 11/01/2012 1150   ALKPHOS 77 11/01/2012 1150   BILITOT 0.4 11/01/2012 1150   GFRNONAA 70* 11/05/2012 0330   GFRAA 81* 11/05/2012 0330    Lipid Panel  No results found for this basename: chol, trig, hdl, cholhdl, vldl, ldlcalc     No results found for this basename: HGBA1C   Lab Results  Component Value Date   CREATININE 0.89 11/05/2012     Lifestyle/ social habits: Rebecca Brennan is a very pleasant woman who resides in [place] with her husband, who is present with her today and an excellent support system for her. She is on disability. She reports that her stress level is a 5/10, citing her health and quality of life as her main sources of stress. She reports she was recently discharged from [facility] due to renal failure, CHF, and polypharmacy of pain medications. She reports that she is more physically active after having 4 visits of HH PT. She reports she was unable to walk to her mailbox, but she is  now able to walk 800 ft to the end of her road.   Nutrition hx/habits: Rebecca Brennan reports that she has changed her eating habits since she has been discharged from the hospital. She reports that she is "trying to follow the diabetic guidelines" and eating more fruits and vegetables. She reports she was diagnosed with diabetes at least 15 years ago and has not had any formal diabetes education. She admits that she has not taken this dx seriously until now. She discloses that due to her multiple medical problems and pain medications, she was constantly "in a blur" and did not eat a proper diet; she reports diet consisted of excessive snacks, pop tarts, and special k bars. She also complains of constipation and incontinence, which she attributes to her pain medications.  She reports that she has been checking CBGs since being discharged, which was something that she did not do on a regular basis. SHe now checks AM fasting and before dinner, achieving readings in the 200's. She does not remember her most recent Hgb A1c, but desires it to be in the 6 range. She is very interested in making lifestyle changes and eating a balanced diet to help improve her quality of life.   Diet recall: Breakfast: piece of cheese toast on white wheat bread, bacon, 2 prunes OR frozen french toast sticks with SF syrup, water; Lunch: salad and apple OR sandwich, water; Dinner: homemade chicken salad OR pimento cheese and crackers OR grilled chicken and corn on the cob. Pt reports she consumes 1 diet [employer] daily. The remainder of her beverages consist of water.  Nutrition Diagnosis: Nutrition related knowledge deficit r/t no prior diabetes education AEB pt hx.  Nutrition Intervention: Nutrition rx: 1200 kcal NAS< diabetic diet; 3 meals per day; limit 1 starch per meal; limit snacks to include only during long periods of time between meals; low calorie beverages only; physical activity as tolerated  Education/Counseling  Provided: Educated pt on principles of diabetic diet. Discussed carbohydrate metabolism in relation to diabetes. Educated pt on basic self-management principles including: signs and symptoms of hyperglycemia and hypoglycemia, goals for fasting and postprandial blood sugars, goals for Hgb A1c, importance of checking feet, importance  of keeping PCP appointments, and foot care. Educated pt on plate method, portion sizes, and sources of carbohydrate. Discussed importance of regular meal pattern. Discussed importance of adding sources of whole grains to diet to improve glycemic control. Discussed slowly adding fiber into the diet and to increase fluid consumption to avoid constipation. Also encouraged to choose low fat dairy, lean meats, and whole fruits and vegetables more often. Discussed options of artificial sweeteners and encouraged pt to use which brand she liked best. Discussed nutritional content of foods commonly eaten and discussed healthier alternatives. Discussed importance of compliance to prevent further complications of disease. Educated pt on importance of physical activity (goal of at least 30 minutes 5 times per week) along with a healthy diet to achieve weight loss and glycemic goals. Encouraged slow, moderate weight loss of 1-2# per week, or 7-10% of current body weight. Provided "Diabetes and You" and "Carbohydrate Counting and Meal Planning" handouts. Used TeachBack to assess understanding.   Understanding, Motivation, Ability to Follow Recommendations: Expect fair to good compliance.  Monitoring and Evaluation: Goals: 1) 0.5-2# wt loss per week; 2) Hgb A1c < 7.0; 3) Fasting blood sugar: 70-130, postprandial < 180; 4) Physical activity as tolerated  Recommendations: 1) For weight loss: 1200-1300 kcals daily; 2) Break exercise up into smaller, more frequent sessions; 3) Try to eat around the same time each day  F/U: PRN. Pt does not desire follow-up at this time. RD contact information  provided.  Melody Haver, RD, LDN 12/02/2012  Appt EndTime: 1146

## 2012-12-04 ENCOUNTER — Ambulatory Visit (INDEPENDENT_AMBULATORY_CARE_PROVIDER_SITE_OTHER): Payer: 59 | Admitting: Psychiatry

## 2012-12-04 ENCOUNTER — Encounter (HOSPITAL_COMMUNITY): Payer: Self-pay | Admitting: Psychiatry

## 2012-12-04 DIAGNOSIS — F4310 Post-traumatic stress disorder, unspecified: Secondary | ICD-10-CM
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
## 2012-12-05 NOTE — Patient Instructions (Signed)
Discussed orally 

## 2012-12-13 ENCOUNTER — Ambulatory Visit (INDEPENDENT_AMBULATORY_CARE_PROVIDER_SITE_OTHER): Payer: 59 | Admitting: Psychiatry

## 2012-12-13 DIAGNOSIS — F4310 Post-traumatic stress disorder, unspecified: Secondary | ICD-10-CM
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
## 2012-12-13 NOTE — Patient Instructions (Signed)
Discussed orally 

## 2012-12-18 ENCOUNTER — Ambulatory Visit (INDEPENDENT_AMBULATORY_CARE_PROVIDER_SITE_OTHER): Payer: 59 | Admitting: Psychiatry

## 2012-12-18 DIAGNOSIS — F4310 Post-traumatic stress disorder, unspecified: Secondary | ICD-10-CM

## 2012-12-20 NOTE — Patient Instructions (Signed)
Discussed orally 
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
## 2012-12-26 ENCOUNTER — Ambulatory Visit (INDEPENDENT_AMBULATORY_CARE_PROVIDER_SITE_OTHER): Payer: 59 | Admitting: Psychiatry

## 2012-12-26 DIAGNOSIS — F4310 Post-traumatic stress disorder, unspecified: Secondary | ICD-10-CM
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
## 2012-12-26 NOTE — Patient Instructions (Signed)
Discussed orally 

## 2013-01-01 ENCOUNTER — Ambulatory Visit (INDEPENDENT_AMBULATORY_CARE_PROVIDER_SITE_OTHER): Payer: 59 | Admitting: Psychiatry

## 2013-01-01 DIAGNOSIS — F4310 Post-traumatic stress disorder, unspecified: Secondary | ICD-10-CM

## 2013-01-01 NOTE — Patient Instructions (Addendum)
Discussed orally 
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
## 2013-01-08 ENCOUNTER — Ambulatory Visit (HOSPITAL_COMMUNITY): Payer: Self-pay | Admitting: Psychiatry

## 2013-01-14 ENCOUNTER — Telehealth: Payer: Self-pay | Admitting: *Deleted

## 2013-01-14 NOTE — Telephone Encounter (Signed)
Faxed approval per Dr. Rennis Golden to hold Xarelto 20mg  for 5-7 days for procedures.

## 2013-01-15 ENCOUNTER — Ambulatory Visit (INDEPENDENT_AMBULATORY_CARE_PROVIDER_SITE_OTHER): Payer: 59 | Admitting: Psychiatry

## 2013-01-15 DIAGNOSIS — F4310 Post-traumatic stress disorder, unspecified: Secondary | ICD-10-CM
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
## 2013-01-15 NOTE — Patient Instructions (Signed)
Discussed orally 

## 2013-01-23 ENCOUNTER — Ambulatory Visit (HOSPITAL_COMMUNITY): Payer: Self-pay | Admitting: Psychiatry

## 2013-01-27 ENCOUNTER — Ambulatory Visit (INDEPENDENT_AMBULATORY_CARE_PROVIDER_SITE_OTHER): Payer: 59 | Admitting: Psychiatry

## 2013-01-27 DIAGNOSIS — F4310 Post-traumatic stress disorder, unspecified: Secondary | ICD-10-CM

## 2013-01-28 NOTE — Patient Instructions (Signed)
Discussed orally 
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
## 2013-02-06 ENCOUNTER — Ambulatory Visit (INDEPENDENT_AMBULATORY_CARE_PROVIDER_SITE_OTHER): Payer: 59 | Admitting: Psychiatry

## 2013-02-06 DIAGNOSIS — F4310 Post-traumatic stress disorder, unspecified: Secondary | ICD-10-CM
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
## 2013-02-06 NOTE — Patient Instructions (Signed)
Discussed orally 

## 2013-02-19 ENCOUNTER — Ambulatory Visit (INDEPENDENT_AMBULATORY_CARE_PROVIDER_SITE_OTHER): Payer: Medicare Other | Admitting: Psychiatry

## 2013-02-19 DIAGNOSIS — F4310 Post-traumatic stress disorder, unspecified: Secondary | ICD-10-CM

## 2013-02-20 NOTE — Patient Instructions (Signed)
Discussed orally 
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
## 2013-03-05 ENCOUNTER — Ambulatory Visit (INDEPENDENT_AMBULATORY_CARE_PROVIDER_SITE_OTHER): Payer: Medicare Other | Admitting: Psychiatry

## 2013-03-05 DIAGNOSIS — F4310 Post-traumatic stress disorder, unspecified: Secondary | ICD-10-CM

## 2013-03-05 NOTE — Patient Instructions (Signed)
Discussed orally 
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
## 2013-03-13 ENCOUNTER — Other Ambulatory Visit: Payer: Self-pay

## 2013-03-18 ENCOUNTER — Telehealth: Payer: Self-pay | Admitting: Internal Medicine

## 2013-03-18 NOTE — Telephone Encounter (Signed)
Message forwarded to J. Elkins, RN.  

## 2013-03-18 NOTE — Telephone Encounter (Signed)
Faxed to [facility] (Dr. Peggye Pitt) permission per Dr. Rennis Golden to hold xarelto 20mg  for 5-7 days prior to procedure. Also called office to give V/O and informed patient.

## 2013-03-18 NOTE — Telephone Encounter (Signed)
Dr Gaetana Michaelis sent fax over about 2 weeks ago for her to come off Xarelto on 11/11 to do procedure  Dr Weldon Inches has not received instructions and signed approval back.  Dr Caro Laroche office refaxed today. Need Today or surgery will be cancelled. Please call.

## 2013-03-19 ENCOUNTER — Ambulatory Visit (INDEPENDENT_AMBULATORY_CARE_PROVIDER_SITE_OTHER): Payer: Medicare Other | Admitting: Psychiatry

## 2013-03-19 DIAGNOSIS — F4310 Post-traumatic stress disorder, unspecified: Secondary | ICD-10-CM
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
## 2013-03-19 NOTE — Patient Instructions (Signed)
Discussed orally 

## 2013-03-27 ENCOUNTER — Telehealth: Payer: Self-pay | Admitting: *Deleted

## 2013-03-27 ENCOUNTER — Encounter: Payer: Self-pay | Admitting: Internal Medicine

## 2013-03-27 ENCOUNTER — Telehealth: Payer: Self-pay

## 2013-03-27 ENCOUNTER — Ambulatory Visit (INDEPENDENT_AMBULATORY_CARE_PROVIDER_SITE_OTHER): Payer: Medicare Other | Admitting: Internal Medicine

## 2013-03-27 ENCOUNTER — Telehealth: Payer: Self-pay | Admitting: Internal Medicine

## 2013-03-27 VITALS — BP 120/64 | HR 58 | Ht 62.0 in | Wt 181.2 lb

## 2013-03-27 DIAGNOSIS — E1149 Type 2 diabetes mellitus with other diabetic neurological complication: Secondary | ICD-10-CM

## 2013-03-27 DIAGNOSIS — I10 Essential (primary) hypertension: Secondary | ICD-10-CM

## 2013-03-27 DIAGNOSIS — I4892 Unspecified atrial flutter: Secondary | ICD-10-CM

## 2013-03-27 MED ORDER — RIVAROXABAN 20 MG PO TABS: 20.0000 mg | ORAL_TABLET | Freq: Every day | ORAL | Status: DC

## 2013-03-27 NOTE — Telephone Encounter (Signed)
Returned call and pt verified x 2.  Pt informed it appears Chelley, CMA was calling to let her know PA approved.  Pt verbalized understanding and agreed w/ plan.  Pt also stated a fax should be coming about her stopping Xarelto for a procedure and wanted to know if it had been received.  Pt informed Dr. Dorene Grebe, RN are still seeing patients and will process fax once received.  Pt verbalized understanding and agreed w/ plan.

## 2013-03-27 NOTE — Patient Instructions (Signed)
Your physician wants you to follow-up in: 1 year. You will receive a reminder letter in the mail two months in advance. If you don't receive a letter, please call our office to schedule the follow-up appointment.  

## 2013-03-27 NOTE — Telephone Encounter (Signed)
Faxed clearance to hold xarelto for 5-7 days for all procedures to [facility] @ [phone]

## 2013-03-27 NOTE — Progress Notes (Signed)
OFFICE NOTE  Chief Complaint:  Routine follow-up  Primary Care Physician: Estanislado Pandy, MD  HPI:  Rebecca Brennan is a pleasant 57 yo female with presentation for narcotic detox at [facility], who had nausea and diarrhea with signs of possible infection. Her EKG demonstrated atrial flutter with RVR. I just reviewed her echo which shows normal chamber sizes and preserved LV function. It is unclear how long she had been in flutter. I recommended TEE cardioversion which she underwent successfully in the hospital. Subsequently she was maintained on Xarelto and has been taking that medication without difficulty. This is approximately 5-6 months ago. She returns today for followup and recently her husband had retired. Insurance is now not interested in covering her medicine as is typical. She is wondering if she still needs to be on the Xarelto. She denies any further palpitations or tachycardia. She is now doing much better off of her pain medications.  PMHx:  Past Medical History  Diagnosis Date  . Diabetes mellitus without complication   . Arthritis   . Neuropathy   . Charcot foot due to diabetes mellitus   . PONV (postoperative nausea and vomiting)   . Hypertension   . Dysrhythmia     afib/flutter    Past Surgical History  Procedure Laterality Date  . Cesarean section  1978  . Appendectomy    . Foot surgery    . Hand surgery      carpal tunnel left, trigger finger right thumb,middle finger,  . Tee without cardioversion N/A 11/05/2012    Procedure: TRANSESOPHAGEAL ECHOCARDIOGRAM (TEE);  Surgeon: Chrystie Nose, MD;  Location: [facility] ENDOSCOPY;  Service: Cardiovascular;  Laterality: N/A;  . Cardioversion N/A 11/05/2012    Procedure: CARDIOVERSION;  Surgeon: Chrystie Nose, MD;  Location: [facility] ENDOSCOPY;  Service: Cardiovascular;  Laterality: N/A;    FAMHx:  Family History  Problem Relation Age of Onset  . Arthritis Mother   . Diabetes Father   . Healthy Sister   . Healthy Sister   .  Healthy Sister   . Healthy Child   . Depression Maternal Aunt   . Depression Paternal Aunt     SOCHx:   reports that she has never smoked. She has never used smokeless tobacco. She reports that she does not drink alcohol or use illicit drugs.  ALLERGIES:  Allergies  Allergen Reactions  . Contrast Media [Iodinated Diagnostic Agents] Anaphylaxis    IVP-DYE (PATIENT STATES THAT SHE DIED ON THE OPERATING ROOM TABLE)  . Erythromycin Other (See Comments)    SEVERE YEAST INFECTION  . Tegretol [Carbamazepine] Other (See Comments)    Increased heart rate  . Morphine And Related Rash    Red line following the vein and itching in the affected area.    ROS: A comprehensive review of systems was negative except for: Constitutional: positive for chronic pain  HOME MEDS: Current Outpatient Prescriptions  Medication Sig Dispense Refill  . Blood Glucose Monitoring Suppl (ACCU-CHEK AVIVA PLUS) W/DEVICE KIT 1 each by Other route 3 (three) times daily.      . Dapagliflozin Propanediol (FARXIGA) 5 MG TABS Take 5 mg by mouth daily as needed.      Marland Kitchen estradiol (ESTRING) 2 MG vaginal ring Place 2 mg vaginally every 3 (three) months. follow package directions      . glucose blood (ACCU-CHEK AVIVA PLUS) test strip 1 each by Other route 3 (three) times daily.      Marland Kitchen lisinopril (PRINIVIL,ZESTRIL) 2.5 MG tablet Take  2.5 mg by mouth daily as needed.      . metFORMIN (GLUCOPHAGE) 500 MG tablet Take 500 mg by mouth 5 (five) times daily.      . metoprolol (LOPRESSOR) 50 MG tablet Take 1 tablet (50 mg total) by mouth 2 (two) times daily.  180 tablet  3  . nystatin (MYCOSTATIN/NYSTOP) 100000 UNIT/GM POWD Apply 1 g topically 3 (three) times daily.      Marland Kitchen omeprazole (PRILOSEC) 20 MG capsule Take 20 mg by mouth every morning.      . ondansetron (ZOFRAN-ODT) 8 MG disintegrating tablet Take 8 mg by mouth every 8 (eight) hours as needed for nausea.      . Oxycodone HCl 10 MG TABS Take 10 mg by mouth 4 (four) times daily  as needed.      . pregabalin (LYRICA) 150 MG capsule Take 150 mg by mouth 3 (three) times daily.      . Rivaroxaban (XARELTO) 20 MG TABS tablet Take 1 tablet (20 mg total) by mouth daily with breakfast.  30 tablet  0  . traZODone (DESYREL) 50 MG tablet Take 50 mg by mouth at bedtime.       No current facility-administered medications for this visit.    LABS/IMAGING: No results found for this or any previous visit (from the past 48 hour(s)). No results found.  VITALS: BP 120/64  Pulse 58  Ht 5\' 2"  (1.575 m)  Wt 181 lb 3.2 oz (82.192 kg)  BMI 33.13 kg/m2  EXAM: General appearance: alert and no distress Neck: no carotid bruit and no JVD Lungs: clear to auscultation bilaterally Heart: regular rate and rhythm, S1, S2 normal, no murmur, click, rub or gallop Abdomen: soft, non-tender; bowel sounds normal; no masses,  no organomegaly and obese Extremities: extremities normal, atraumatic, no cyanosis or edema Pulses: 2+ and symmetric Skin: Skin color, texture, turgor normal. No rashes or lesions Neurologic: Grossly normal Psych: Mood, affect normal  EKG: Normal sinus rhythm at 58  ASSESSMENT: 1. Atrial flutter in the setting of narcotic withdrawal 2. Hypertension 3. Type 2 diabetes with neuropathy 4. Obesity  PLAN: 1.   Rebecca Brennan has not had recurrence of her atrial flutter. I think this is an isolated event most likely related to narcotic withdrawal, infection and/or electrolyte abnormalities. Nevertheless her CHADS2VASC score is 3, suggesting that it would be beneficial to continue anticoagulation. She's had no significant bleeding problems with Xarelto I recommend she stays on that. Her blood pressure is well-controlled today. She does appear more awake off of the narcotics. I will go ahead and try to get prior approval for the medicine which she is tolerating and has been taking since discharge in the hospital. Hopefully this will be continued. Plan to see her back annually or  sooner as necessary.  Chrystie Nose, MD, [facility] Attending Cardiologist [facility]  Jarin Cornfield C 03/27/2013, 11:31 AM

## 2013-03-27 NOTE — Telephone Encounter (Signed)
Patient is returning your call from this am.

## 2013-03-27 NOTE — Telephone Encounter (Signed)
Prior authorization for Xarelto completed and approved through 03/27/2014. Reference #[id number].  Pharmacy and patient notified.

## 2013-04-09 ENCOUNTER — Ambulatory Visit (INDEPENDENT_AMBULATORY_CARE_PROVIDER_SITE_OTHER): Payer: Medicare Other | Admitting: Psychiatry

## 2013-04-09 ENCOUNTER — Ambulatory Visit (HOSPITAL_COMMUNITY): Payer: Medicare Other | Admitting: Psychiatry

## 2013-04-09 DIAGNOSIS — F4310 Post-traumatic stress disorder, unspecified: Secondary | ICD-10-CM

## 2013-04-09 NOTE — Patient Instructions (Signed)
Discussed orally 
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
## 2013-04-23 ENCOUNTER — Ambulatory Visit (INDEPENDENT_AMBULATORY_CARE_PROVIDER_SITE_OTHER): Payer: Medicare Other | Admitting: Psychiatry

## 2013-04-23 DIAGNOSIS — F4310 Post-traumatic stress disorder, unspecified: Secondary | ICD-10-CM
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
## 2013-04-23 NOTE — Patient Instructions (Signed)
Discussed orally 

## 2013-05-14 ENCOUNTER — Ambulatory Visit (INDEPENDENT_AMBULATORY_CARE_PROVIDER_SITE_OTHER): Payer: Medicare Other | Admitting: Psychiatry

## 2013-05-14 DIAGNOSIS — F4310 Post-traumatic stress disorder, unspecified: Secondary | ICD-10-CM

## 2013-05-14 NOTE — Patient Instructions (Signed)
Discussed orally 
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
## 2013-05-28 ENCOUNTER — Ambulatory Visit (INDEPENDENT_AMBULATORY_CARE_PROVIDER_SITE_OTHER): Payer: Medicare Other | Admitting: Psychiatry

## 2013-05-28 DIAGNOSIS — F4310 Post-traumatic stress disorder, unspecified: Secondary | ICD-10-CM
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
## 2013-05-29 NOTE — Patient Instructions (Signed)
Discussed orally 

## 2013-06-11 ENCOUNTER — Ambulatory Visit (INDEPENDENT_AMBULATORY_CARE_PROVIDER_SITE_OTHER): Payer: Medicare Other | Admitting: Psychiatry

## 2013-06-11 DIAGNOSIS — F4310 Post-traumatic stress disorder, unspecified: Secondary | ICD-10-CM

## 2013-06-11 NOTE — Patient Instructions (Signed)
Discussed orally 
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
## 2013-06-20 ENCOUNTER — Other Ambulatory Visit: Payer: Self-pay

## 2013-06-20 DIAGNOSIS — I4892 Unspecified atrial flutter: Secondary | ICD-10-CM

## 2013-06-20 MED ORDER — METOPROLOL TARTRATE 50 MG PO TABS: 50.0000 mg | ORAL_TABLET | Freq: Two times a day (BID) | ORAL | Status: DC

## 2013-06-26 ENCOUNTER — Ambulatory Visit (INDEPENDENT_AMBULATORY_CARE_PROVIDER_SITE_OTHER): Payer: Medicare Other | Admitting: Psychiatry

## 2013-06-26 DIAGNOSIS — F4310 Post-traumatic stress disorder, unspecified: Secondary | ICD-10-CM

## 2013-06-27 NOTE — Patient Instructions (Signed)
Discussed orally 
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
## 2013-07-10 ENCOUNTER — Ambulatory Visit (INDEPENDENT_AMBULATORY_CARE_PROVIDER_SITE_OTHER): Payer: Medicare Other | Admitting: Psychiatry

## 2013-07-10 DIAGNOSIS — F4310 Post-traumatic stress disorder, unspecified: Secondary | ICD-10-CM
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
## 2013-07-10 NOTE — Patient Instructions (Signed)
Discussed orally 

## 2013-07-22 ENCOUNTER — Other Ambulatory Visit (HOSPITAL_COMMUNITY)
Admission: RE | Admit: 2013-07-22 | Payer: Medicare Other | Source: Ambulatory Visit | Admitting: Obstetrics & Gynecology

## 2013-07-22 DIAGNOSIS — Z124 Encounter for screening for malignant neoplasm of cervix: Secondary | ICD-10-CM

## 2013-07-23 ENCOUNTER — Ambulatory Visit (INDEPENDENT_AMBULATORY_CARE_PROVIDER_SITE_OTHER): Payer: Medicare Other | Admitting: Psychiatry

## 2013-07-23 DIAGNOSIS — F4310 Post-traumatic stress disorder, unspecified: Secondary | ICD-10-CM
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
## 2013-07-24 NOTE — Patient Instructions (Signed)
Discussed orally 

## 2013-08-11 ENCOUNTER — Ambulatory Visit (INDEPENDENT_AMBULATORY_CARE_PROVIDER_SITE_OTHER): Payer: Medicare Other | Admitting: Psychiatry

## 2013-08-11 ENCOUNTER — Ambulatory Visit (HOSPITAL_COMMUNITY): Payer: Self-pay | Admitting: Psychiatry

## 2013-08-11 DIAGNOSIS — F4310 Post-traumatic stress disorder, unspecified: Secondary | ICD-10-CM
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
## 2013-08-11 NOTE — Patient Instructions (Signed)
Discussed orally 

## 2013-08-23 ENCOUNTER — Other Ambulatory Visit: Payer: Self-pay | Admitting: Internal Medicine

## 2013-08-27 ENCOUNTER — Ambulatory Visit (INDEPENDENT_AMBULATORY_CARE_PROVIDER_SITE_OTHER): Payer: Medicare Other | Admitting: Psychiatry

## 2013-08-27 DIAGNOSIS — F4310 Post-traumatic stress disorder, unspecified: Secondary | ICD-10-CM

## 2013-08-27 DIAGNOSIS — F3289 Other specified depressive episodes: Secondary | ICD-10-CM

## 2013-08-27 DIAGNOSIS — F329 Major depressive disorder, single episode, unspecified: Secondary | ICD-10-CM
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
## 2013-08-28 NOTE — Patient Instructions (Signed)
Discussed orally 

## 2013-09-01 NOTE — Telephone Encounter (Signed)
This encounter is closed. 

## 2013-09-10 ENCOUNTER — Ambulatory Visit (INDEPENDENT_AMBULATORY_CARE_PROVIDER_SITE_OTHER): Payer: Medicare Other | Admitting: Psychiatry

## 2013-09-10 DIAGNOSIS — F3289 Other specified depressive episodes: Secondary | ICD-10-CM

## 2013-09-10 DIAGNOSIS — F4310 Post-traumatic stress disorder, unspecified: Secondary | ICD-10-CM

## 2013-09-10 DIAGNOSIS — F329 Major depressive disorder, single episode, unspecified: Secondary | ICD-10-CM
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
## 2013-09-11 NOTE — Patient Instructions (Signed)
Discussed orally 

## 2013-10-07 ENCOUNTER — Ambulatory Visit (INDEPENDENT_AMBULATORY_CARE_PROVIDER_SITE_OTHER): Payer: Medicare Other | Admitting: Psychiatry

## 2013-10-07 DIAGNOSIS — F329 Major depressive disorder, single episode, unspecified: Secondary | ICD-10-CM

## 2013-10-07 DIAGNOSIS — F4310 Post-traumatic stress disorder, unspecified: Secondary | ICD-10-CM

## 2013-10-07 DIAGNOSIS — F3289 Other specified depressive episodes: Secondary | ICD-10-CM
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
## 2013-10-07 NOTE — Patient Instructions (Signed)
Discussed orally 

## 2013-10-22 ENCOUNTER — Ambulatory Visit (INDEPENDENT_AMBULATORY_CARE_PROVIDER_SITE_OTHER): Payer: Medicare Other | Admitting: Psychiatry

## 2013-10-22 DIAGNOSIS — F4310 Post-traumatic stress disorder, unspecified: Secondary | ICD-10-CM

## 2013-10-22 DIAGNOSIS — F3289 Other specified depressive episodes: Secondary | ICD-10-CM

## 2013-10-22 DIAGNOSIS — F329 Major depressive disorder, single episode, unspecified: Secondary | ICD-10-CM

## 2013-10-22 NOTE — Patient Instructions (Signed)
Discussed orally 
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
## 2013-11-05 ENCOUNTER — Ambulatory Visit (INDEPENDENT_AMBULATORY_CARE_PROVIDER_SITE_OTHER): Payer: Medicare Other | Admitting: Psychiatry

## 2013-11-05 DIAGNOSIS — F3289 Other specified depressive episodes: Secondary | ICD-10-CM

## 2013-11-05 DIAGNOSIS — F329 Major depressive disorder, single episode, unspecified: Secondary | ICD-10-CM

## 2013-11-05 DIAGNOSIS — F4310 Post-traumatic stress disorder, unspecified: Secondary | ICD-10-CM

## 2013-11-05 NOTE — Patient Instructions (Signed)
Discussed orally 
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
## 2013-11-26 ENCOUNTER — Ambulatory Visit (INDEPENDENT_AMBULATORY_CARE_PROVIDER_SITE_OTHER): Payer: Medicare Other | Admitting: Psychiatry

## 2013-11-26 DIAGNOSIS — F4310 Post-traumatic stress disorder, unspecified: Secondary | ICD-10-CM

## 2013-11-26 DIAGNOSIS — F3289 Other specified depressive episodes: Secondary | ICD-10-CM

## 2013-11-26 DIAGNOSIS — F329 Major depressive disorder, single episode, unspecified: Secondary | ICD-10-CM
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
## 2013-11-26 NOTE — Patient Instructions (Signed)
Discussed orally 

## 2013-12-10 ENCOUNTER — Ambulatory Visit (HOSPITAL_COMMUNITY): Payer: Self-pay | Admitting: Psychiatry

## 2013-12-29 ENCOUNTER — Ambulatory Visit (INDEPENDENT_AMBULATORY_CARE_PROVIDER_SITE_OTHER): Payer: Medicare Other | Admitting: Psychiatry

## 2013-12-29 DIAGNOSIS — F3289 Other specified depressive episodes: Secondary | ICD-10-CM

## 2013-12-29 DIAGNOSIS — F4310 Post-traumatic stress disorder, unspecified: Secondary | ICD-10-CM

## 2013-12-29 DIAGNOSIS — F329 Major depressive disorder, single episode, unspecified: Secondary | ICD-10-CM

## 2013-12-29 NOTE — Patient Instructions (Signed)
Discussed orally 
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
## 2014-01-13 ENCOUNTER — Ambulatory Visit (INDEPENDENT_AMBULATORY_CARE_PROVIDER_SITE_OTHER): Payer: Medicare Other | Admitting: Psychiatry

## 2014-01-13 DIAGNOSIS — F3289 Other specified depressive episodes: Secondary | ICD-10-CM

## 2014-01-13 DIAGNOSIS — F329 Major depressive disorder, single episode, unspecified: Secondary | ICD-10-CM

## 2014-01-13 DIAGNOSIS — F4310 Post-traumatic stress disorder, unspecified: Secondary | ICD-10-CM
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
## 2014-01-13 NOTE — Patient Instructions (Signed)
Discussed orally 

## 2014-01-15 ENCOUNTER — Ambulatory Visit (HOSPITAL_COMMUNITY): Payer: Self-pay | Admitting: Psychiatry

## 2014-02-02 ENCOUNTER — Ambulatory Visit (INDEPENDENT_AMBULATORY_CARE_PROVIDER_SITE_OTHER): Payer: Medicare Other | Admitting: Psychiatry

## 2014-02-02 DIAGNOSIS — F3289 Other specified depressive episodes: Secondary | ICD-10-CM

## 2014-02-02 DIAGNOSIS — F329 Major depressive disorder, single episode, unspecified: Secondary | ICD-10-CM

## 2014-02-02 DIAGNOSIS — F4310 Post-traumatic stress disorder, unspecified: Secondary | ICD-10-CM
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
## 2014-02-02 NOTE — Patient Instructions (Signed)
Discussed orally 

## 2014-02-19 ENCOUNTER — Encounter (INDEPENDENT_AMBULATORY_CARE_PROVIDER_SITE_OTHER): Payer: Self-pay | Admitting: Internal Medicine

## 2014-02-19 ENCOUNTER — Ambulatory Visit (INDEPENDENT_AMBULATORY_CARE_PROVIDER_SITE_OTHER): Payer: Medicare Other | Admitting: Internal Medicine

## 2014-02-19 VITALS — BP 106/56 | HR 76 | Temp 98.2°F | Wt 231.5 lb

## 2014-02-19 DIAGNOSIS — R601 Generalized edema: Secondary | ICD-10-CM

## 2014-02-19 DIAGNOSIS — R195 Other fecal abnormalities: Secondary | ICD-10-CM

## 2014-02-19 DIAGNOSIS — R197 Diarrhea, unspecified: Secondary | ICD-10-CM

## 2014-02-19 DIAGNOSIS — D509 Iron deficiency anemia, unspecified: Secondary | ICD-10-CM

## 2014-02-19 MED ORDER — METRONIDAZOLE 250 MG PO TABS: 250.0000 mg | ORAL_TABLET | Freq: Three times a day (TID) | ORAL | Status: DC

## 2014-02-19 NOTE — Patient Instructions (Signed)
Flagyl 250mg  three times a day x 10 days. Imodium three times a day.   Stool studies.

## 2014-02-19 NOTE — Progress Notes (Signed)
Subjective:    Patient ID: Rebecca Brennan, female    DOB: 1955/08/26, 58 y.o.   MRN: [id number]  HPI Referred to our office by Dr. Quintin Alto for diarrhea. She says the diarrhea started 10 days ago. For the past week, the diarrhea has been worse.  Her stools are very loose. She may have 8-9 stools a day. She says she stays on the commode for an extended period of time because of the diarrhea. Before the diarrhea, she was having normal stools. . She has hx of narcotic induced constipation.  She was at [facility] in [place] for narcotic dependence and was also renal failure. She says her stomach rumbles and gurgles. She says she can't go to church because of the noise.  Sh is taking a Probiotic which really has not helped. She started Imodium x 1 daily x 2 days. Yesterday, she had one stool but was loose and explosive. She has been on Metformin for years. Hx of hemorrhoidectomy x 2 in the past.  Presently taking Keflex for cellulitis to her lower legs (started yesterday). She says the swelling in her leg and abdomen is from the Lyrica. She has been on Lyrica x 6 months. Lyrica has helped her neuropathy. She was started on Lasix $Remove'40mg'bmDxTFu$  BID yesterday.  No recent antibiotics except for the Keflex that was started yesterday. 11/28/2013 Albumin 4.2, Globulin 2.2, Total bili 0.5, ALP 50, AST 13, ALT 11.  HA1C 8.1 Appetite is okay.  There is no weight loss. She has actually gained weight. Weight 03/27/2014 181. Today her weight is 231.( Weight gain of 50 pounds.) She says her abdomen is hard from the swelling.  . No melena or BRRB. Dr. Edythe Lynn office called patient today and told her she was anemic (Results were not available during OV). Never undergone a colonoscopy in the past Diabetic x 20 yrs. Patient is maintained on Xarelto for atrial flutter (Hx of) 11/05/2012 Echo: EF 55-60%  02/19/2014 H and H 8.0 and 28.9, MCV 70, Platelet ct 151 Glucose 132, BUN 14, Creatinine 0.86, NA 140, K 4.5, Chloride 99,  Protein 6.4, Albumin 4.2, Globulin 2.2, Total bili 0.5, ALP 50, AST 13, ALT 11.     Review of Systems Past Medical History  Diagnosis Date  . Diabetes mellitus without complication   . Arthritis   . Neuropathy   . Charcot foot due to diabetes mellitus   . PONV (postoperative nausea and vomiting)   . Hypertension   . Dysrhythmia     afib/flutter  . Atrial fibrillation     Past Surgical History  Procedure Laterality Date  . Cesarean section  1978  . Appendectomy    . Foot surgery    . Hand surgery      carpal tunnel left, trigger finger right thumb,middle finger,  . Tee without cardioversion N/A 11/05/2012    Procedure: TRANSESOPHAGEAL ECHOCARDIOGRAM (TEE);  Surgeon: Pixie Casino, MD;  Location: [facility];  Service: Cardiovascular;  Laterality: N/A;  . Cardioversion N/A 11/05/2012    Procedure: CARDIOVERSION;  Surgeon: Pixie Casino, MD;  Location: [facility] ENDOSCOPY;  Service: Cardiovascular;  Laterality: N/A;  . Hemorrhoid surgery      x 2    Allergies  Allergen Reactions  . Contrast Media [Iodinated Diagnostic Agents] Anaphylaxis    IVP-DYE (PATIENT STATES THAT SHE DIED ON THE OPERATING ROOM TABLE)  . Erythromycin Other (See Comments)    SEVERE YEAST INFECTION  . Tegretol [Carbamazepine] Other (See Comments)  Increased heart rate  . Morphine And Related Rash    Red line following the vein and itching in the affected area.    Current Outpatient Prescriptions on File Prior to Visit  Medication Sig Dispense Refill  . Dapagliflozin Propanediol (FARXIGA) 5 MG TABS Take 5 mg by mouth daily as needed.      Marland Kitchen glucose blood (ACCU-CHEK AVIVA PLUS) test strip 1 each by Other route 3 (three) times daily.      Marland Kitchen lisinopril (PRINIVIL,ZESTRIL) 2.5 MG tablet Take 2.5 mg by mouth daily as needed.      . metFORMIN (GLUCOPHAGE) 500 MG tablet Take 850 mg by mouth 3 (three) times daily.       . metoprolol (LOPRESSOR) 50 MG tablet Take 1 tablet (50 mg total) by mouth 2 (two) times  daily.  180 tablet  3  . omeprazole (PRILOSEC) 20 MG capsule Take 20 mg by mouth every morning.      . pregabalin (LYRICA) 150 MG capsule Take 200 mg by mouth 2 (two) times daily.       . traZODone (DESYREL) 50 MG tablet Take 50 mg by mouth at bedtime.      Alveda Reasons 20 MG TABS tablet TAKE 1 TABLET BY MOUTH DAILY WITH BREAKFAST  90 tablet  1  . Blood Glucose Monitoring Suppl (ACCU-CHEK AVIVA PLUS) W/DEVICE KIT 1 each by Other route 3 (three) times daily.       No current facility-administered medications on file prior to visit.        Objective:   Physical Exam  Filed Vitals:   02/19/14 1534  BP: 106/56  Pulse: 76  Temp: 98.2 F (36.8 C)  Weight: 231 lb 8 oz (105.008 kg)    Alert and oriented. Skin warm and dry. Oral mucosa is moist.   . Sclera anicteric, conjunctivae is pink. Thyroid not enlarged. No cervical lymphadenopathy. Lungs clear. Heart regular rate and rhythm.  Abdomen is soft. Bowel sounds are positive. No hepatomegaly. No abdominal masses felt. No tenderness. 4+ edema to extremities, 3-4 + edema to abdomen.  Stool brown, loose and guaiac positive. Dr Laural Golden in room with patient.        Assessment & Plan:  Diarrhea x 10 days. No recent antibiotics. C diff needs to be ruled out.  Anasarca probably multifactorial. Possible from anemia.   GI pathogen Flagyl $RemoveBeforeD'250mg'lAcjIiBoWWXvwV$  TID x 10 days foir the diarrhea. Imodium three times a day for the diarrhea.  Patient will need a colonoscopy at some point.  Dr. Laural Golden is aware and will talk with patient tomorrow. Guaiac positive stool: Colonic neoplasm needs to be ruled out. Patient has never undergone a colonoscopy in the past. Hemoglobin 8.0.

## 2014-02-23 ENCOUNTER — Ambulatory Visit (INDEPENDENT_AMBULATORY_CARE_PROVIDER_SITE_OTHER): Payer: Medicare Other | Admitting: Psychiatry

## 2014-02-23 ENCOUNTER — Other Ambulatory Visit: Payer: Self-pay | Admitting: Internal Medicine

## 2014-02-23 DIAGNOSIS — F4310 Post-traumatic stress disorder, unspecified: Secondary | ICD-10-CM

## 2014-02-23 NOTE — Patient Instructions (Signed)
Discussed orally 
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
## 2014-02-24 LAB — GASTROINTESTINAL PATHOGEN PANEL PCR
C. DIFFICILE TOX A/B, PCR: NEGATIVE
CAMPYLOBACTER, PCR: NEGATIVE
Cryptosporidium, PCR: NEGATIVE
E COLI (ETEC) LT/ST, PCR: NEGATIVE
E COLI (STEC) STX1/STX2, PCR: NEGATIVE
E coli 0157, PCR: NEGATIVE
Giardia lamblia, PCR: NEGATIVE
Norovirus, PCR: NEGATIVE
ROTAVIRUS, PCR: NEGATIVE
SALMONELLA, PCR: NEGATIVE
Shigella, PCR: NEGATIVE

## 2014-02-26 ENCOUNTER — Other Ambulatory Visit (INDEPENDENT_AMBULATORY_CARE_PROVIDER_SITE_OTHER): Payer: Self-pay | Admitting: *Deleted

## 2014-02-26 ENCOUNTER — Telehealth (INDEPENDENT_AMBULATORY_CARE_PROVIDER_SITE_OTHER): Payer: Self-pay | Admitting: *Deleted

## 2014-02-26 ENCOUNTER — Encounter (HOSPITAL_COMMUNITY): Payer: Self-pay | Admitting: Pharmacy Technician

## 2014-02-26 DIAGNOSIS — R197 Diarrhea, unspecified: Secondary | ICD-10-CM

## 2014-02-26 DIAGNOSIS — K625 Hemorrhage of anus and rectum: Secondary | ICD-10-CM

## 2014-02-26 DIAGNOSIS — D62 Acute posthemorrhagic anemia: Secondary | ICD-10-CM

## 2014-02-26 MED ORDER — SOD PHOS MONO-SOD PHOS DIBASIC 1.102-0.398 G PO TABS: 32.0000 | ORAL_TABLET | Freq: Once | ORAL | Status: DC

## 2014-02-26 NOTE — Telephone Encounter (Signed)
Patient needs osmo pill prep 

## 2014-03-02 ENCOUNTER — Telehealth (INDEPENDENT_AMBULATORY_CARE_PROVIDER_SITE_OTHER): Payer: Self-pay | Admitting: *Deleted

## 2014-03-02 NOTE — Telephone Encounter (Signed)
I agree we should postpone EGD and colonoscopy until acute issues have resolved

## 2014-03-02 NOTE — Telephone Encounter (Signed)
Talked to patient's husband, [facility] Rebecca [facility] -- Mr Rebecca Brennan states she had renal failure and again and she was given blood -- she is scheduled for TCS/EGD this Friday (10/30) do we need to reschedule her procedure -- also she wants to do Osmo pill prep, but with her being diabetic and the renal failure I don't think she can do that -- please advise of both thanks
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
## 2014-03-06 ENCOUNTER — Ambulatory Visit (HOSPITAL_COMMUNITY): Admission: RE | Admit: 2014-03-06 | Payer: Medicare Other | Source: Ambulatory Visit | Admitting: Internal Medicine

## 2014-03-06 ENCOUNTER — Encounter (HOSPITAL_COMMUNITY): Admission: RE | Payer: Self-pay | Source: Ambulatory Visit

## 2014-03-06 SURGERY — COLONOSCOPY
Anesthesia: Moderate Sedation

## 2014-03-11 ENCOUNTER — Ambulatory Visit (HOSPITAL_COMMUNITY): Payer: Self-pay | Admitting: Psychiatry

## 2014-03-25 ENCOUNTER — Ambulatory Visit (HOSPITAL_COMMUNITY): Payer: Self-pay | Admitting: Psychiatry

## 2014-03-27 ENCOUNTER — Ambulatory Visit: Payer: Self-pay | Admitting: Internal Medicine

## 2014-04-06 ENCOUNTER — Telehealth (INDEPENDENT_AMBULATORY_CARE_PROVIDER_SITE_OTHER): Payer: Self-pay | Admitting: *Deleted

## 2014-04-06 ENCOUNTER — Other Ambulatory Visit (INDEPENDENT_AMBULATORY_CARE_PROVIDER_SITE_OTHER): Payer: Self-pay | Admitting: *Deleted

## 2014-04-06 DIAGNOSIS — Z1211 Encounter for screening for malignant neoplasm of colon: Secondary | ICD-10-CM

## 2014-04-06 DIAGNOSIS — R197 Diarrhea, unspecified: Secondary | ICD-10-CM

## 2014-04-06 NOTE — Telephone Encounter (Signed)
Patient needs movi prep 

## 2014-04-07 ENCOUNTER — Telehealth (INDEPENDENT_AMBULATORY_CARE_PROVIDER_SITE_OTHER): Payer: Self-pay | Admitting: *Deleted

## 2014-04-07 MED ORDER — PEG-KCL-NACL-NASULF-NA ASC-C 100 G PO SOLR: 1.0000 | Freq: Once | ORAL | Status: DC

## 2014-04-07 NOTE — Telephone Encounter (Signed)
Stanton KidneyDebra would like for Dewayne Hatchnn to return her call at [phone]. She is needing to speak with her about her TCS.

## 2014-04-07 NOTE — Telephone Encounter (Signed)
Spoke to patient, she wanted to confirm TCS appt date & time

## 2014-04-08 ENCOUNTER — Ambulatory Visit (INDEPENDENT_AMBULATORY_CARE_PROVIDER_SITE_OTHER): Payer: Medicare Other | Admitting: Psychiatry

## 2014-04-08 DIAGNOSIS — F4310 Post-traumatic stress disorder, unspecified: Secondary | ICD-10-CM
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
## 2014-04-08 NOTE — Patient Instructions (Signed)
Discussed orally 

## 2014-04-29 ENCOUNTER — Encounter (INDEPENDENT_AMBULATORY_CARE_PROVIDER_SITE_OTHER): Payer: Self-pay

## 2014-04-29 ENCOUNTER — Ambulatory Visit (HOSPITAL_COMMUNITY): Payer: Self-pay | Admitting: Psychiatry

## 2014-05-05 ENCOUNTER — Ambulatory Visit (HOSPITAL_COMMUNITY): Payer: Self-pay | Admitting: Psychiatry

## 2014-05-13 ENCOUNTER — Ambulatory Visit (INDEPENDENT_AMBULATORY_CARE_PROVIDER_SITE_OTHER): Payer: Medicare Other | Admitting: Psychiatry

## 2014-05-13 DIAGNOSIS — F4310 Post-traumatic stress disorder, unspecified: Secondary | ICD-10-CM
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
## 2014-05-13 NOTE — Patient Instructions (Signed)
Discussed orally 

## 2014-05-14 DIAGNOSIS — E780 Pure hypercholesterolemia: Secondary | ICD-10-CM | POA: Diagnosis not present

## 2014-05-14 DIAGNOSIS — E1165 Type 2 diabetes mellitus with hyperglycemia: Secondary | ICD-10-CM | POA: Diagnosis not present

## 2014-05-14 DIAGNOSIS — E6609 Other obesity due to excess calories: Secondary | ICD-10-CM | POA: Diagnosis not present

## 2014-05-14 DIAGNOSIS — Z1389 Encounter for screening for other disorder: Secondary | ICD-10-CM | POA: Diagnosis not present

## 2014-05-14 DIAGNOSIS — I10 Essential (primary) hypertension: Secondary | ICD-10-CM | POA: Diagnosis not present

## 2014-05-14 DIAGNOSIS — R5383 Other fatigue: Secondary | ICD-10-CM | POA: Diagnosis not present

## 2014-05-21 ENCOUNTER — Encounter (HOSPITAL_COMMUNITY): Payer: Self-pay | Admitting: *Deleted

## 2014-05-21 ENCOUNTER — Encounter (HOSPITAL_COMMUNITY)
Admission: RE | Disposition: A | Discharge status: Home / Self Care | Payer: Self-pay | Source: Ambulatory Visit | Attending: Internal Medicine

## 2014-05-21 ENCOUNTER — Ambulatory Visit (HOSPITAL_COMMUNITY)
Admission: RE | Admit: 2014-05-21 | Discharge: 2014-05-21 | Disposition: A | Discharge status: Home / Self Care | Payer: Medicare Other | Source: Ambulatory Visit | Attending: Internal Medicine | Admitting: Internal Medicine

## 2014-05-21 DIAGNOSIS — K219 Gastro-esophageal reflux disease without esophagitis: Secondary | ICD-10-CM | POA: Diagnosis not present

## 2014-05-21 DIAGNOSIS — R197 Diarrhea, unspecified: Secondary | ICD-10-CM

## 2014-05-21 DIAGNOSIS — D649 Anemia, unspecified: Secondary | ICD-10-CM | POA: Insufficient documentation

## 2014-05-21 DIAGNOSIS — K921 Melena: Secondary | ICD-10-CM | POA: Diagnosis not present

## 2014-05-21 DIAGNOSIS — Q2733 Arteriovenous malformation of digestive system vessel: Secondary | ICD-10-CM | POA: Insufficient documentation

## 2014-05-21 DIAGNOSIS — Z79899 Other long term (current) drug therapy: Secondary | ICD-10-CM | POA: Insufficient documentation

## 2014-05-21 DIAGNOSIS — R195 Other fecal abnormalities: Secondary | ICD-10-CM | POA: Diagnosis not present

## 2014-05-21 DIAGNOSIS — Z1211 Encounter for screening for malignant neoplasm of colon: Secondary | ICD-10-CM

## 2014-05-21 DIAGNOSIS — F418 Other specified anxiety disorders: Secondary | ICD-10-CM | POA: Diagnosis not present

## 2014-05-21 DIAGNOSIS — E119 Type 2 diabetes mellitus without complications: Secondary | ICD-10-CM | POA: Diagnosis not present

## 2014-05-21 DIAGNOSIS — I4891 Unspecified atrial fibrillation: Secondary | ICD-10-CM | POA: Insufficient documentation

## 2014-05-21 DIAGNOSIS — I10 Essential (primary) hypertension: Secondary | ICD-10-CM | POA: Diagnosis not present

## 2014-05-21 HISTORY — PX: COLONOSCOPY: SHX5424

## 2014-05-21 HISTORY — DX: Anxiety disorder, unspecified: F41.9

## 2014-05-21 HISTORY — DX: Diarrhea, unspecified: R19.7

## 2014-05-21 HISTORY — DX: Depression, unspecified: F32.A

## 2014-05-21 HISTORY — DX: Anemia, unspecified: D64.9

## 2014-05-21 HISTORY — DX: Major depressive disorder, single episode, unspecified: F32.9

## 2014-05-21 HISTORY — DX: Gastro-esophageal reflux disease without esophagitis: K21.9

## 2014-05-21 LAB — GLUCOSE, CAPILLARY: GLUCOSE-CAPILLARY: 114 mg/dL — AB (ref 70–99)

## 2014-05-21 SURGERY — COLONOSCOPY
Anesthesia: Moderate Sedation

## 2014-05-21 MED ORDER — MEPERIDINE HCL 50 MG/ML IJ SOLN
INTRAMUSCULAR | Status: AC
  Filled 2014-05-21: qty 1

## 2014-05-21 MED ORDER — STERILE WATER FOR IRRIGATION IR SOLN
Status: DC | PRN
  Administered 2014-05-21: 12:00:00

## 2014-05-21 MED ORDER — MIDAZOLAM HCL 5 MG/5ML IJ SOLN
INTRAMUSCULAR | Status: DC | PRN
  Administered 2014-05-21 (×2): 2 mg via INTRAVENOUS

## 2014-05-21 MED ORDER — MIDAZOLAM HCL 5 MG/5ML IJ SOLN
INTRAMUSCULAR | Status: AC
  Filled 2014-05-21: qty 10

## 2014-05-21 MED ORDER — SODIUM CHLORIDE 0.9 % IV SOLN
INTRAVENOUS | Status: DC
  Administered 2014-05-21: 12:00:00 via INTRAVENOUS

## 2014-05-21 MED ORDER — DICYCLOMINE HCL 10 MG PO CAPS: 10.0000 mg | ORAL_CAPSULE | Freq: Two times a day (BID) | ORAL | Status: DC | PRN

## 2014-05-21 MED ORDER — MEPERIDINE HCL 50 MG/ML IJ SOLN
INTRAMUSCULAR | Status: DC | PRN
  Administered 2014-05-21 (×2): 25 mg via INTRAVENOUS

## 2014-05-21 NOTE — Discharge Instructions (Signed)
Resume usual medications and high fiber diet. Dicyclomine 10 mg by mouth twice daily as needed. No driving for 24 hours. Notify if you have rectal bleeding.  High-Fiber Diet Fiber is found in fruits, vegetables, and grains. A high-fiber diet encourages the addition of more whole grains, legumes, fruits, and vegetables in your diet. The recommended amount of fiber for adult males is 38 g per day. For adult females, it is 25 g per day. Pregnant and lactating women should get 28 g of fiber per day. If you have a digestive or bowel problem, ask your caregiver for advice before adding high-fiber foods to your diet. Eat a variety of high-fiber foods instead of only a select few type of foods.  PURPOSE  To increase stool bulk.  To make bowel movements more regular to prevent constipation.  To lower cholesterol.  To prevent overeating. WHEN IS THIS DIET USED?  It may be used if you have constipation and hemorrhoids.  It may be used if you have uncomplicated diverticulosis (intestine condition) and irritable bowel syndrome.  It may be used if you need help with weight management.  It may be used if you want to add it to your diet as a protective measure against atherosclerosis, diabetes, and cancer. SOURCES OF FIBER  Whole-grain breads and cereals.  Fruits, such as apples, oranges, bananas, berries, prunes, and pears.  Vegetables, such as green peas, carrots, sweet potatoes, beets, broccoli, cabbage, spinach, and artichokes.  Legumes, such split peas, soy, lentils.  Almonds. FIBER CONTENT IN FOODS Starches and Grains / Dietary Fiber (g)  Cheerios, 1 cup / 3 g  Corn Flakes cereal, 1 cup / 0.7 g  [place] crispy treat cereal, 1 cup / 0.3 g  Instant oatmeal (cooked),  cup / 2 g  Frosted wheat cereal, 1 cup / 5.1 g  Brown, long-grain [place] (cooked), 1 cup / 3.5 g  White, long-grain [place] (cooked), 1 cup / 0.6 g  Enriched macaroni (cooked), 1 cup / 2.5 g Legumes / Dietary Fiber  (g)  Baked beans (canned, plain, or vegetarian),  cup / 5.2 g  Kidney beans (canned),  cup / 6.8 g  Pinto beans (cooked),  cup / 5.5 g Breads and Crackers / Dietary Fiber (g)  Plain or honey graham crackers, 2 squares / 0.7 g  Saltine crackers, 3 squares / 0.3 g  Plain, salted pretzels, 10 pieces / 1.8 g  Whole-wheat bread, 1 slice / 1.9 g  White bread, 1 slice / 0.7 g  Raisin bread, 1 slice / 1.2 g  Plain bagel, 3 oz / 2 g  Flour tortilla, 1 oz / 0.9 g  Corn tortilla, 1 small / 1.5 g  Hamburger or hotdog bun, 1 small / 0.9 g Fruits / Dietary Fiber (g)  Apple with skin, 1 medium / 4.4 g  Sweetened applesauce,  cup / 1.5 g  Banana,  medium / 1.5 g  Grapes, 10 grapes / 0.4 g  Orange, 1 small / 2.3 g  Raisin, 1.5 oz / 1.6 g  Melon, 1 cup / 1.4 g Vegetables / Dietary Fiber (g)  Green beans (canned),  cup / 1.3 g  Carrots (cooked),  cup / 2.3 g  Broccoli (cooked),  cup / 2.8 g  Peas (cooked),  cup / 4.4 g  Mashed potatoes,  cup / 1.6 g  Lettuce, 1 cup / 0.5 g  Corn (canned),  cup / 1.6 g  Tomato,  cup / 1.1 g Document Released: 04/24/2005  Document Revised: 10/24/2011 Document Reviewed: 07/27/2011 [facility] Patient Information 2015 [place], [place]. This information is not intended to replace advice given to you by your health care provider. Make sure you discuss any questions you have with your health care provider.   Colonoscopy, Care After Refer to this sheet in the next few weeks. These instructions provide you with information on caring for yourself after your procedure. Your health care provider may also give you more specific instructions. Your treatment has been planned according to current medical practices, but problems sometimes occur. Call your health care provider if you have any problems or questions after your procedure. WHAT TO EXPECT AFTER THE PROCEDURE  After your procedure, it is typical to have the following:  A small amount  of blood in your stool.  Moderate amounts of gas and mild abdominal cramping or bloating. HOME CARE INSTRUCTIONS  Do not drive, operate machinery, or sign important documents for 24 hours.  You may shower and resume your regular physical activities, but move at a slower pace for the first 24 hours.  Take frequent rest periods for the first 24 hours.  Walk around or put a warm pack on your abdomen to help reduce abdominal cramping and bloating.  Drink enough fluids to keep your urine clear or pale yellow.  You may resume your normal diet as instructed by your health care provider. Avoid heavy or fried foods that are hard to digest.  Avoid drinking alcohol for 24 hours or as instructed by your health care provider.  Only take over-the-counter or prescription medicines as directed by your health care provider.  If a tissue sample (biopsy) was taken during your procedure:  Do not take aspirin or blood thinners for 7 days, or as instructed by your health care provider.  Do not drink alcohol for 7 days, or as instructed by your health care provider.  Eat soft foods for the first 24 hours. SEEK MEDICAL CARE IF: You have persistent spotting of blood in your stool 2-3 days after the procedure. SEEK IMMEDIATE MEDICAL CARE IF:  You have more than a small spotting of blood in your stool.  You pass large blood clots in your stool.  Your abdomen is swollen (distended).  You have nausea or vomiting.  You have a fever.  You have increasing abdominal pain that is not relieved with medicine. Document Released: 12/07/2003 Document Revised: 02/12/2013 Document Reviewed: 12/30/2012 [facility] Patient Information 2015 [place], [place]. This information is not intended to replace advice given to you by your health care provider. Make sure you discuss any questions you have with your health care provider.

## 2014-05-21 NOTE — Op Note (Signed)
COLONOSCOPY PROCEDURE REPORT  PATIENT:  Rebecca Brennan  MR#:  [id number] Birthdate:  13-Dec-1955, 59 y.o., female Endoscopist:  Dr. Malissa HippoNajeeb U. Mandi Mattioli, MD Referred By:  Dr. Estanislado PandyPaul W Sasser, MD  Procedure Date: 05/21/2014  Procedure:   Colonoscopy  Indications: Patient is 59 year old Caucasian female with multiple medical problems who has history of heme positive stool and anemia. She also has history of diarrhea but now having normal stools.  Informed Consent:  The procedure and risks were reviewed with the patient and informed consent was obtained.  Medications:  Demerol 50 mg IV Versed 4 mg IV  Description of procedure:  After a digital rectal exam was performed, that colonoscope was advanced from the anus through the rectum and colon to the area of the cecum, ileocecal valve and appendiceal orifice. The cecum was deeply intubated. These structures were well-seen and photographed for the record. From the level of the cecum and ileocecal valve, the scope was slowly and cautiously withdrawn. The mucosal surfaces were carefully surveyed utilizing scope tip to flexion to facilitate fold flattening as needed. The scope was pulled down into the rectum where a thorough exam including retroflexion was performed.  Findings:   Prep satisfactory. 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 Mucosa of rest of the colon was normal. Normal rectal mucosa. Scarring noted at and above dentate line from previous hemorrhoid surgery. Diminished anal sphincter tone.   Therapeutic/Diagnostic Maneuvers Performed:   None  Complications:  None  Cecal Withdrawal Time:  11 minutes  Impression:  Examination performed to cecum. 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 These were left alone. No evidence of endoscopic colitis. Scarring at and above the dentate line from previous hemorrhoid surgery.  Recommendations:  Standard instructions  given. Dicyclomine 10 mg by mouth twice a day when necessary.  Flint Hakeem U  05/21/2014 12:19 PM  CC: Dr. Estanislado PandySASSER,PAUL W, MD & Dr. Bonnetta BarryNo ref. provider found

## 2014-05-21 NOTE — H&P (Signed)
Rebecca Brennan is an 59 y.o. female.   Chief Complaint: Patient is here for colonoscopy. HPI: Patient is 59 year old Caucasian female with multiple medical problems who was initially seen about 3 months ago for anemia and heme positive stools and diarrhea. GI pathogen panel was negative. She was empirically treated with metronidazole. At that time she also had massive fluid overload and GI evaluation was postponed. She has lost over 40 pounds with diureses. She is not having sporadic diarrhea and actually sometimes feels constipated. She has seen blood on the tissue in the past but not in the last few months. She denies abdominal pain nausea or vomiting. She is now off anticoagulant.  Past Medical History  Diagnosis Date  . Diabetes mellitus without complication   . Arthritis   . Neuropathy   . Charcot foot due to diabetes mellitus   . PONV (postoperative nausea and vomiting)   . Hypertension   . Dysrhythmia     afib/flutter  . Atrial flutter   . Diarrhea   . Anxiety   . Depression   . GERD (gastroesophageal reflux disease)   . Anemia     Past Surgical History  Procedure Laterality Date  . Cesarean section  1978  . Appendectomy    . Foot surgery    . Hand surgery      carpal tunnel left, trigger finger right thumb,middle finger,  . Tee without cardioversion N/A 11/05/2012    Procedure: TRANSESOPHAGEAL ECHOCARDIOGRAM (TEE);  Surgeon: Pixie Casino, MD;  Location: [facility];  Service: Cardiovascular;  Laterality: N/A;  . Cardioversion N/A 11/05/2012    Procedure: CARDIOVERSION;  Surgeon: Pixie Casino, MD;  Location: [facility] ENDOSCOPY;  Service: Cardiovascular;  Laterality: N/A;  . Hemorrhoid surgery      x 2    Family History  Problem Relation Age of Onset  . Arthritis Mother   . Diabetes Father   . Healthy Sister   . Healthy Sister   . Healthy Sister   . Healthy Child   . Depression Maternal Aunt   . Depression Paternal Aunt    Social History:  reports that she has never  smoked. She has never used smokeless tobacco. She reports that she does not drink alcohol or use illicit drugs.  Allergies:  Allergies  Allergen Reactions  . Contrast Media [Iodinated Diagnostic Agents] Anaphylaxis    IVP-DYE (PATIENT STATES THAT SHE DIED ON THE OPERATING ROOM TABLE)  . Erythromycin Other (See Comments)    SEVERE YEAST INFECTION  . Tegretol [Carbamazepine] Other (See Comments)    Increased heart rate  . Morphine And Related Rash    Red line following the vein and itching in the affected area.    Medications Prior to Admission  Medication Sig Dispense Refill  . baclofen (LIORESAL) 10 MG tablet Take 10 mg by mouth 4 (four) times daily.     . Dapagliflozin Propanediol (FARXIGA) 5 MG TABS Take 5 mg by mouth daily.     Marland Kitchen FLUoxetine (PROZAC) 10 MG capsule Take 10 mg by mouth daily.  4  . furosemide (LASIX) 40 MG tablet Take 40 mg by mouth daily as needed (swelling caused by lyrica).     . Lactobacillus-Inulin ([place]) Take by mouth.    Marland Kitchen lisinopril (PRINIVIL,ZESTRIL) 10 MG tablet Take 10 mg by mouth daily.  6  . metFORMIN (GLUCOPHAGE) 850 MG tablet Take 2,550 mg by mouth daily.  3  . metoprolol (LOPRESSOR) 50 MG tablet Take 1  tablet (50 mg total) by mouth 2 (two) times daily. 180 tablet 3  . omeprazole (PRILOSEC) 20 MG capsule Take 20 mg by mouth every morning.    Marland Kitchen oxyCODONE-acetaminophen (PERCOCET) 10-325 MG per tablet Take 1 tablet by mouth every 6 (six) hours as needed for pain.     . peg 3350 powder (MOVIPREP) 100 G SOLR Take 1 kit (200 g total) by mouth once. 1 kit 0  . pregabalin (LYRICA) 200 MG capsule Take 200 mg by mouth 2 (two) times daily.    . traZODone (DESYREL) 50 MG tablet Take 50 mg by mouth at bedtime.    . metroNIDAZOLE (FLAGYL) 250 MG tablet Take 1 tablet (250 mg total) by mouth 3 (three) times daily. (Patient not taking: Reported on 05/15/2014) 30 tablet 0  . XARELTO 20 MG TABS tablet TAKE 1 TABLET BY MOUTH DAILY WITH BREAKFAST  (Patient not taking: Reported on 05/15/2014) 90 tablet 0    Results for orders placed or performed during the hospital encounter of 05/21/14 (from the past 48 hour(s))  Glucose, capillary     Status: Abnormal   Collection Time: 05/21/14 11:15 AM  Result Value Ref Range   Glucose-Capillary 114 (H) 70 - 99 mg/dL   No results found.  ROS  Blood pressure 147/65, pulse 57, temperature 97.6 F (36.4 C), temperature source Oral, resp. rate 20, height 5' 2.5" (1.588 m), weight 182 lb (82.555 kg), SpO2 98 %. Physical Exam  Constitutional: She appears well-developed and well-nourished.  HENT:  Mouth/Throat: Oropharynx is clear and moist.  Eyes: Conjunctivae are normal. No scleral icterus.  Neck: No thyromegaly present.  Cardiovascular: Normal rate, regular rhythm and normal heart sounds.   No murmur heard. Respiratory: Effort normal and breath sounds normal.  GI: Soft.  Indurated area below umbilicus felt to be thickened abdominal wall from prior surgery  Musculoskeletal: Edema: 2+ pitting edema of both legs.  Lymphadenopathy:    She has no cervical adenopathy.  Neurological: She is alert.  Skin: Skin is warm and dry.     Assessment/Plan History of diarrhea and heme positive stool. Diagnostic colonoscopy.  Jamair Cato U 05/21/2014, 11:43 AM

## 2014-05-22 ENCOUNTER — Encounter (HOSPITAL_COMMUNITY): Payer: Self-pay | Admitting: Internal Medicine

## 2014-05-26 ENCOUNTER — Encounter: Payer: Self-pay | Admitting: Internal Medicine

## 2014-05-26 ENCOUNTER — Ambulatory Visit (INDEPENDENT_AMBULATORY_CARE_PROVIDER_SITE_OTHER): Payer: Medicare Other | Admitting: Internal Medicine

## 2014-05-26 VITALS — BP 114/70 | HR 54 | Ht 62.0 in | Wt 193.5 lb

## 2014-05-26 DIAGNOSIS — R195 Other fecal abnormalities: Secondary | ICD-10-CM

## 2014-05-26 DIAGNOSIS — I10 Essential (primary) hypertension: Secondary | ICD-10-CM

## 2014-05-26 DIAGNOSIS — I483 Typical atrial flutter: Secondary | ICD-10-CM | POA: Diagnosis not present

## 2014-05-26 DIAGNOSIS — Z7901 Long term (current) use of anticoagulants: Secondary | ICD-10-CM

## 2014-05-26 MED ORDER — RIVAROXABAN 20 MG PO TABS
20.0000 mg | ORAL_TABLET | Freq: Every day | ORAL | Status: DC
Start: 2014-05-26 — End: 2014-05-26

## 2014-05-26 MED ORDER — RIVAROXABAN 20 MG PO TABS: 20.0000 mg | ORAL_TABLET | Freq: Every day | ORAL | Status: AC

## 2014-05-26 NOTE — Progress Notes (Signed)
OFFICE NOTE  Chief Complaint:  Routine follow-up  Primary Care Physician: Estanislado Pandy, MD  HPI:  Rebecca Brennan is a pleasant 59 yo female with presentation for narcotic detox at [facility], who had nausea and diarrhea with signs of possible infection. Her EKG demonstrated atrial flutter with RVR. I just reviewed her echo which shows normal chamber sizes and preserved LV function. It is unclear how long she had been in flutter. I recommended TEE cardioversion which she underwent successfully in the hospital. Subsequently she was maintained on Xarelto and has been taking that medication without difficulty. This is approximately 5-6 months ago. She returns today for followup and recently her husband had retired. Insurance is now not interested in covering her medicine as is typical. She is wondering if she still needs to be on the Xarelto. She denies any further palpitations or tachycardia. She is now doing much better off of her pain medications.  I saw [name] back in the office today. She reports that in October she was admitted to [facility] with acute renal failure and severe anemia. Workup included a colonoscopy in fact she had also been having diarrhea. Colonoscopy failed to reveal any source of bleeding. There is no description of any acute GI bleeding. She went onto iron and had some transfusions. Renal function per their report has returned to normal. I do not have records for [facility] however we'll be requesting those. During that hospitalization her Xarelto was stopped and never restarted.  PMHx:  Past Medical History  Diagnosis Date  . Diabetes mellitus without complication   . Arthritis   . Neuropathy   . Charcot foot due to diabetes mellitus   . PONV (postoperative nausea and vomiting)   . Hypertension   . Dysrhythmia     afib/flutter  . Atrial flutter   . Diarrhea   . Anxiety   . Depression   . GERD (gastroesophageal reflux disease)   . Anemia     Past  Surgical History  Procedure Laterality Date  . Cesarean section  1978  . Appendectomy    . Foot surgery    . Hand surgery      carpal tunnel left, trigger finger right thumb,middle finger,  . Tee without cardioversion N/A 11/05/2012    Procedure: TRANSESOPHAGEAL ECHOCARDIOGRAM (TEE);  Surgeon: Chrystie Nose, MD;  Location: [facility] ENDOSCOPY;  Service: Cardiovascular;  Laterality: N/A;  . Cardioversion N/A 11/05/2012    Procedure: CARDIOVERSION;  Surgeon: Chrystie Nose, MD;  Location: [facility] ENDOSCOPY;  Service: Cardiovascular;  Laterality: N/A;  . Hemorrhoid surgery      x 2  . Colonoscopy N/A 05/21/2014    Procedure: COLONOSCOPY;  Surgeon: Malissa Hippo, MD;  Location: [facility] ENDO SUITE;  Service: Endoscopy;  Laterality: N/A;  1200    FAMHx:  Family History  Problem Relation Age of Onset  . Arthritis Mother   . Diabetes Father   . Healthy Sister   . Healthy Sister   . Healthy Sister   . Healthy Child   . Depression Maternal Aunt   . Depression Paternal Aunt     SOCHx:   reports that she has never smoked. She has never used smokeless tobacco. She reports that she does not drink alcohol or use illicit drugs.  ALLERGIES:  Allergies  Allergen Reactions  . Contrast Media [Iodinated Diagnostic Agents] Anaphylaxis    IVP-DYE (PATIENT STATES THAT SHE DIED ON THE OPERATING ROOM TABLE)  . Erythromycin Other (See Comments)  SEVERE YEAST INFECTION  . Tegretol [Carbamazepine] Other (See Comments)    Increased heart rate  . Morphine And Related Rash    Red line following the vein and itching in the affected area.    ROS: A comprehensive review of systems was negative except for: Constitutional: positive for chronic pain  HOME MEDS: Current Outpatient Prescriptions  Medication Sig Dispense Refill  . baclofen (LIORESAL) 10 MG tablet Take 10 mg by mouth 4 (four) times daily.     . Dapagliflozin Propanediol (FARXIGA) 5 MG TABS Take 5 mg by mouth daily.     Marland Kitchen dicyclomine (BENTYL) 10 MG  capsule Take 1 capsule (10 mg total) by mouth 2 (two) times daily as needed. 60 capsule 0  . Docusate Calcium (STOOL SOFTENER PO) Take by mouth daily as needed.    Marland Kitchen FLUoxetine (PROZAC) 10 MG capsule Take 10 mg by mouth daily.  4  . furosemide (LASIX) 40 MG tablet Take 40 mg by mouth daily as needed (swelling caused by lyrica).     . Lactobacillus-Inulin (CULTURELLE HEALTH & WELLNESS PO) Take by mouth.    Marland Kitchen lisinopril (PRINIVIL,ZESTRIL) 10 MG tablet Take 10 mg by mouth daily.  6  . metFORMIN (GLUCOPHAGE) 850 MG tablet Take 2,550 mg by mouth daily.  3  . metoprolol (LOPRESSOR) 50 MG tablet Take 1 tablet (50 mg total) by mouth 2 (two) times daily. 180 tablet 3  . omeprazole (PRILOSEC) 20 MG capsule Take 20 mg by mouth every morning.    Marland Kitchen oxyCODONE-acetaminophen (PERCOCET) 10-325 MG per tablet Take 1 tablet by mouth every 6 (six) hours as needed for pain.     . pregabalin (LYRICA) 200 MG capsule Take 200 mg by mouth 2 (two) times daily.    . traZODone (DESYREL) 50 MG tablet Take 50 mg by mouth at bedtime.    . rivaroxaban (XARELTO) 20 MG TABS tablet Take 1 tablet (20 mg total) by mouth daily with supper. 15 tablet 0   No current facility-administered medications for this visit.    LABS/IMAGING: No results found for this or any previous visit (from the past 48 hour(s)). No results found.  VITALS: BP 114/70 mmHg  Pulse 54  Ht  (1.575 m)  Wt 193 lb 8 oz (87.771 kg)  BMI 35.38 kg/m2  EXAM: General appearance: alert and no distress Neck: no carotid bruit and no JVD Lungs: clear to auscultation bilaterally Heart: regular rate and rhythm, S1, S2 normal, no murmur, click, rub or gallop Abdomen: soft, non-tender; bowel sounds normal; no masses,  no organomegaly and obese Extremities: extremities normal, atraumatic, no cyanosis or edema Pulses: 2+ and symmetric Skin: Skin color, texture, turgor normal. No rashes or lesions Neurologic: Grossly normal Psych: Mood, affect  normal  EKG: Normal sinus rhythm at 54  ASSESSMENT: 1. Atrial flutter in the setting of narcotic withdrawal 2. Hypertension 3. Type 2 diabetes with neuropathy 4. Obesity  PLAN: 1.   Mrs. Boran has not had recurrence of her atrial flutter. I think this is an isolated event most likely related to narcotic withdrawal, infection and/or electrolyte abnormalities. Nevertheless her CHADS2VASC score is 3, suggesting that it would be beneficial to continue anticoagulation. She is resuming found to be profoundly anemic in the setting of renal failure. It was not clear that she had any acute GI bleeding. She had a colonoscopy which showed no clear source of bleeding. At this point I do not see compelling reason to continue to hold her Xarelto. She is  at increased risk for stroke. She is not aware of her atrial fibrillation and may be having recurrent A. fib, but I cannot be certain that it was not related only to narcotic withdrawal. We discussed options including aspirin versus Xarelto and I feel more strongly that we should consider the Xarelto unless she has any clear signs of bleeding or recurrent anemia at which case aspirin use would be preferred.  Chrystie NoseKenneth C. Tynasia Mccaul, MD, [facility] Shore HealthFACC Attending Cardiologist [facility]  Elasia Furnish C 05/26/2014, 4:54 PM

## 2014-05-26 NOTE — Patient Instructions (Signed)
Your physician has recommended you make the following change in your medication: RESUME xarelto 20mg  daily   Your physician wants you to follow-up in: 1 year with Dr. Rennis GoldenHilty. You will receive a reminder letter in the mail two months in advance. If you don't receive a letter, please call our office to schedule the follow-up appointment.

## 2014-05-27 ENCOUNTER — Ambulatory Visit (INDEPENDENT_AMBULATORY_CARE_PROVIDER_SITE_OTHER): Payer: Medicare Other | Admitting: Psychiatry

## 2014-05-27 DIAGNOSIS — F4310 Post-traumatic stress disorder, unspecified: Secondary | ICD-10-CM

## 2014-05-27 NOTE — Patient Instructions (Signed)
Discussed orally 
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
## 2014-06-03 DIAGNOSIS — N63 Unspecified lump in breast: Secondary | ICD-10-CM | POA: Diagnosis not present

## 2014-06-03 DIAGNOSIS — N6489 Other specified disorders of breast: Secondary | ICD-10-CM | POA: Diagnosis not present

## 2014-06-03 DIAGNOSIS — R922 Inconclusive mammogram: Secondary | ICD-10-CM | POA: Diagnosis not present

## 2014-06-08 DIAGNOSIS — E11610 Type 2 diabetes mellitus with diabetic neuropathic arthropathy: Secondary | ICD-10-CM | POA: Diagnosis not present

## 2014-06-08 DIAGNOSIS — M5136 Other intervertebral disc degeneration, lumbar region: Secondary | ICD-10-CM | POA: Diagnosis not present

## 2014-06-08 DIAGNOSIS — Z79891 Long term (current) use of opiate analgesic: Secondary | ICD-10-CM | POA: Diagnosis not present

## 2014-06-08 DIAGNOSIS — M47816 Spondylosis without myelopathy or radiculopathy, lumbar region: Secondary | ICD-10-CM | POA: Diagnosis not present

## 2014-06-08 DIAGNOSIS — M545 Low back pain: Secondary | ICD-10-CM | POA: Diagnosis not present

## 2014-06-10 ENCOUNTER — Ambulatory Visit (INDEPENDENT_AMBULATORY_CARE_PROVIDER_SITE_OTHER): Payer: Medicare Other | Admitting: Psychiatry

## 2014-06-10 DIAGNOSIS — F4310 Post-traumatic stress disorder, unspecified: Secondary | ICD-10-CM

## 2014-06-10 NOTE — Patient Instructions (Signed)
Discussed orally 
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
## 2014-06-23 ENCOUNTER — Ambulatory Visit (INDEPENDENT_AMBULATORY_CARE_PROVIDER_SITE_OTHER): Payer: Medicare Other | Admitting: Psychiatry

## 2014-06-23 DIAGNOSIS — F4310 Post-traumatic stress disorder, unspecified: Secondary | ICD-10-CM

## 2014-06-23 NOTE — Patient Instructions (Signed)
Discussed orally 
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
## 2014-06-25 ENCOUNTER — Other Ambulatory Visit (INDEPENDENT_AMBULATORY_CARE_PROVIDER_SITE_OTHER): Payer: Self-pay | Admitting: Internal Medicine

## 2014-07-08 ENCOUNTER — Ambulatory Visit (HOSPITAL_COMMUNITY): Payer: Self-pay | Admitting: Psychiatry

## 2014-07-22 ENCOUNTER — Ambulatory Visit (INDEPENDENT_AMBULATORY_CARE_PROVIDER_SITE_OTHER): Payer: Medicare Other | Admitting: Psychiatry

## 2014-07-22 DIAGNOSIS — E1149 Type 2 diabetes mellitus with other diabetic neurological complication: Secondary | ICD-10-CM | POA: Diagnosis not present

## 2014-07-22 DIAGNOSIS — F4310 Post-traumatic stress disorder, unspecified: Secondary | ICD-10-CM

## 2014-07-22 DIAGNOSIS — M79672 Pain in left foot: Secondary | ICD-10-CM | POA: Diagnosis not present

## 2014-07-22 DIAGNOSIS — M79671 Pain in right foot: Secondary | ICD-10-CM | POA: Diagnosis not present

## 2014-07-22 DIAGNOSIS — E11610 Type 2 diabetes mellitus with diabetic neuropathic arthropathy: Secondary | ICD-10-CM | POA: Diagnosis not present
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
## 2014-07-22 NOTE — Patient Instructions (Signed)
Discussed orally 

## 2014-08-05 ENCOUNTER — Ambulatory Visit (INDEPENDENT_AMBULATORY_CARE_PROVIDER_SITE_OTHER): Payer: Medicare Other | Admitting: Psychiatry

## 2014-08-05 DIAGNOSIS — F4310 Post-traumatic stress disorder, unspecified: Secondary | ICD-10-CM

## 2014-08-05 DIAGNOSIS — F32A Depression, unspecified: Secondary | ICD-10-CM

## 2014-08-05 DIAGNOSIS — F329 Major depressive disorder, single episode, unspecified: Secondary | ICD-10-CM | POA: Diagnosis not present
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
## 2014-08-05 NOTE — Patient Instructions (Signed)
Discussed orally 

## 2014-08-09 ENCOUNTER — Other Ambulatory Visit: Payer: Self-pay | Admitting: Internal Medicine

## 2014-08-10 NOTE — Telephone Encounter (Signed)
Rx(s) sent to pharmacy electronically.  

## 2014-08-20 ENCOUNTER — Ambulatory Visit (INDEPENDENT_AMBULATORY_CARE_PROVIDER_SITE_OTHER): Payer: Medicare Other | Admitting: Psychiatry

## 2014-08-20 DIAGNOSIS — F329 Major depressive disorder, single episode, unspecified: Secondary | ICD-10-CM | POA: Diagnosis not present

## 2014-08-20 DIAGNOSIS — F4310 Post-traumatic stress disorder, unspecified: Secondary | ICD-10-CM | POA: Diagnosis not present

## 2014-08-20 DIAGNOSIS — F32A Depression, unspecified: Secondary | ICD-10-CM
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
## 2014-08-20 NOTE — Patient Instructions (Signed)
Discussed orally 

## 2014-08-25 DIAGNOSIS — Z01419 Encounter for gynecological examination (general) (routine) without abnormal findings: Secondary | ICD-10-CM | POA: Diagnosis not present

## 2014-09-02 ENCOUNTER — Ambulatory Visit (INDEPENDENT_AMBULATORY_CARE_PROVIDER_SITE_OTHER): Payer: Medicare Other | Admitting: Psychiatry

## 2014-09-02 DIAGNOSIS — F329 Major depressive disorder, single episode, unspecified: Secondary | ICD-10-CM | POA: Diagnosis not present

## 2014-09-02 DIAGNOSIS — F32A Depression, unspecified: Secondary | ICD-10-CM
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
## 2014-09-02 NOTE — Patient Instructions (Signed)
Discussed orally 

## 2014-09-08 DIAGNOSIS — I10 Essential (primary) hypertension: Secondary | ICD-10-CM | POA: Diagnosis not present

## 2014-09-08 DIAGNOSIS — N183 Chronic kidney disease, stage 3 (moderate): Secondary | ICD-10-CM | POA: Diagnosis not present

## 2014-09-08 DIAGNOSIS — R5383 Other fatigue: Secondary | ICD-10-CM | POA: Diagnosis not present

## 2014-09-08 DIAGNOSIS — E780 Pure hypercholesterolemia: Secondary | ICD-10-CM | POA: Diagnosis not present

## 2014-09-08 DIAGNOSIS — E1165 Type 2 diabetes mellitus with hyperglycemia: Secondary | ICD-10-CM | POA: Diagnosis not present

## 2014-09-09 ENCOUNTER — Ambulatory Visit (HOSPITAL_COMMUNITY): Payer: Self-pay | Admitting: Psychiatry

## 2014-09-14 DIAGNOSIS — E780 Pure hypercholesterolemia: Secondary | ICD-10-CM | POA: Diagnosis not present

## 2014-09-14 DIAGNOSIS — Z23 Encounter for immunization: Secondary | ICD-10-CM | POA: Diagnosis not present

## 2014-09-14 DIAGNOSIS — E6609 Other obesity due to excess calories: Secondary | ICD-10-CM | POA: Diagnosis not present

## 2014-09-14 DIAGNOSIS — E1165 Type 2 diabetes mellitus with hyperglycemia: Secondary | ICD-10-CM | POA: Diagnosis not present

## 2014-09-14 DIAGNOSIS — I10 Essential (primary) hypertension: Secondary | ICD-10-CM | POA: Diagnosis not present

## 2014-09-14 DIAGNOSIS — R5383 Other fatigue: Secondary | ICD-10-CM | POA: Diagnosis not present

## 2014-09-16 ENCOUNTER — Ambulatory Visit (INDEPENDENT_AMBULATORY_CARE_PROVIDER_SITE_OTHER): Payer: Medicare Other | Admitting: Psychiatry

## 2014-09-16 DIAGNOSIS — E1165 Type 2 diabetes mellitus with hyperglycemia: Secondary | ICD-10-CM | POA: Diagnosis not present

## 2014-09-16 DIAGNOSIS — F329 Major depressive disorder, single episode, unspecified: Secondary | ICD-10-CM | POA: Diagnosis not present

## 2014-09-16 DIAGNOSIS — F32A Depression, unspecified: Secondary | ICD-10-CM
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
## 2014-09-16 NOTE — Patient Instructions (Signed)
Discussed orally 

## 2014-09-21 DIAGNOSIS — E538 Deficiency of other specified B group vitamins: Secondary | ICD-10-CM | POA: Diagnosis not present

## 2014-09-23 DIAGNOSIS — E11610 Type 2 diabetes mellitus with diabetic neuropathic arthropathy: Secondary | ICD-10-CM | POA: Diagnosis not present

## 2014-09-23 DIAGNOSIS — M545 Low back pain: Secondary | ICD-10-CM | POA: Diagnosis not present

## 2014-09-23 DIAGNOSIS — M47816 Spondylosis without myelopathy or radiculopathy, lumbar region: Secondary | ICD-10-CM | POA: Diagnosis not present

## 2014-09-23 DIAGNOSIS — M5136 Other intervertebral disc degeneration, lumbar region: Secondary | ICD-10-CM | POA: Diagnosis not present

## 2014-09-28 DIAGNOSIS — E538 Deficiency of other specified B group vitamins: Secondary | ICD-10-CM | POA: Diagnosis not present

## 2014-09-30 ENCOUNTER — Ambulatory Visit (INDEPENDENT_AMBULATORY_CARE_PROVIDER_SITE_OTHER): Payer: Medicare Other | Admitting: Psychiatry

## 2014-09-30 DIAGNOSIS — F329 Major depressive disorder, single episode, unspecified: Secondary | ICD-10-CM

## 2014-09-30 DIAGNOSIS — F32A Depression, unspecified: Secondary | ICD-10-CM
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
## 2014-09-30 NOTE — Patient Instructions (Signed)
Discussed orally 

## 2014-10-06 DIAGNOSIS — E538 Deficiency of other specified B group vitamins: Secondary | ICD-10-CM | POA: Diagnosis not present

## 2014-10-10 IMAGING — CR DG CHEST 1V PORT
1 series · 1 of 1 positions shown · non-contrast
Comparison: Portable exam 7999 hours without priors for comparison

CLINICAL DATA: Diabetes, nonsmoker

PORTABLE CHEST - 1 VIEW

[AP]
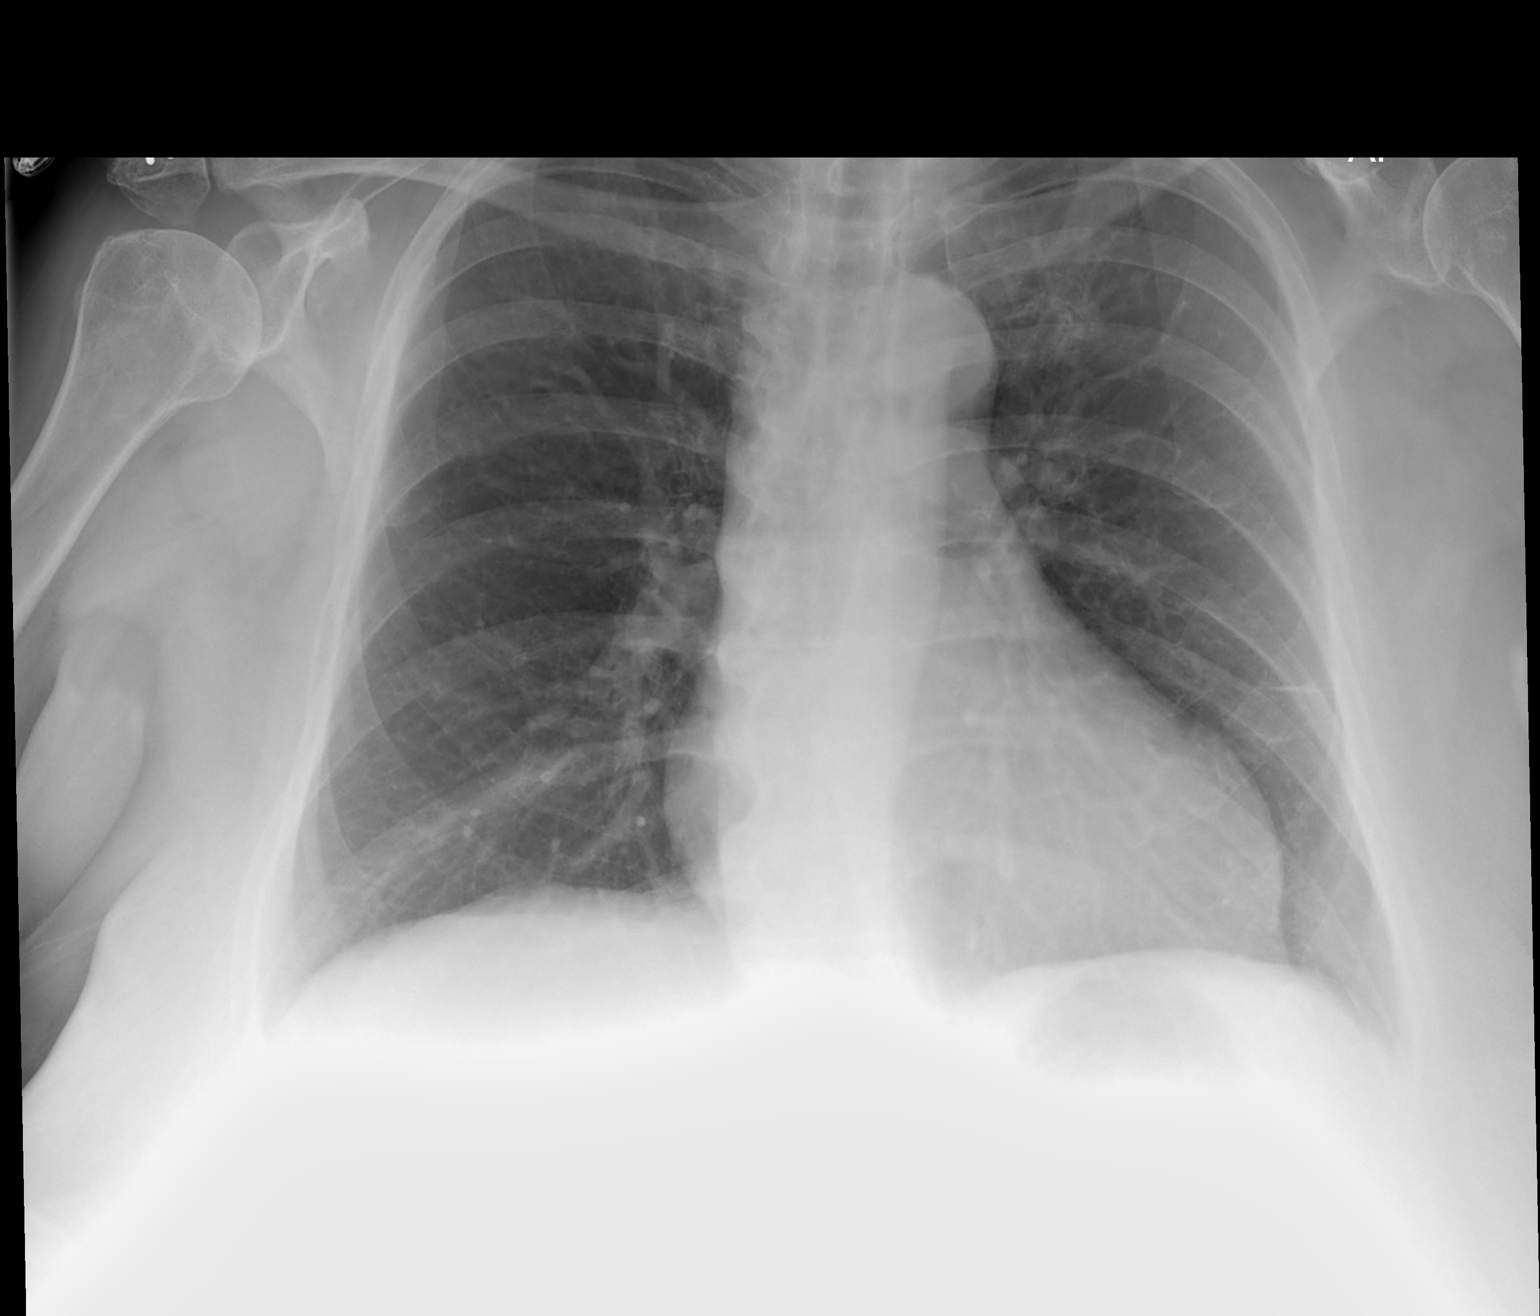

[1 of 1 positions shown; findings below may reference images not displayed]

FINDINGS: Upper normal heart size.
Mediastinal contours and pulmonary vascularity normal.
Minimal bronchitic changes with atelectasis or scarring lingula.
Lungs otherwise clear.
No pleural effusion or pneumothorax.
Bones unremarkable.
IMPRESSION: Minimal bronchitic changes with lingular atelectasis versus
scarring.
No additional abnormalities.
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
## 2014-10-14 ENCOUNTER — Ambulatory Visit (INDEPENDENT_AMBULATORY_CARE_PROVIDER_SITE_OTHER): Payer: Medicare Other | Admitting: Psychiatry

## 2014-10-14 DIAGNOSIS — F4310 Post-traumatic stress disorder, unspecified: Secondary | ICD-10-CM | POA: Diagnosis not present

## 2014-10-14 DIAGNOSIS — F329 Major depressive disorder, single episode, unspecified: Secondary | ICD-10-CM

## 2014-10-14 DIAGNOSIS — F32A Depression, unspecified: Secondary | ICD-10-CM

## 2014-10-14 NOTE — Patient Instructions (Signed)
Discussed orally 
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
## 2014-10-28 ENCOUNTER — Ambulatory Visit (INDEPENDENT_AMBULATORY_CARE_PROVIDER_SITE_OTHER): Payer: Medicare Other | Admitting: Psychiatry

## 2014-10-28 DIAGNOSIS — F329 Major depressive disorder, single episode, unspecified: Secondary | ICD-10-CM

## 2014-10-28 DIAGNOSIS — F32A Depression, unspecified: Secondary | ICD-10-CM

## 2014-10-28 NOTE — Patient Instructions (Signed)
Discussed orally 
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
## 2014-11-02 ENCOUNTER — Other Ambulatory Visit: Payer: Self-pay

## 2014-11-10 DIAGNOSIS — E538 Deficiency of other specified B group vitamins: Secondary | ICD-10-CM | POA: Diagnosis not present

## 2014-11-11 ENCOUNTER — Ambulatory Visit (INDEPENDENT_AMBULATORY_CARE_PROVIDER_SITE_OTHER): Payer: Medicare Other | Admitting: Psychiatry

## 2014-11-11 DIAGNOSIS — F329 Major depressive disorder, single episode, unspecified: Secondary | ICD-10-CM

## 2014-11-11 DIAGNOSIS — F32A Depression, unspecified: Secondary | ICD-10-CM
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
## 2014-11-12 NOTE — Patient Instructions (Signed)
Discussed orally 

## 2014-12-01 ENCOUNTER — Ambulatory Visit (INDEPENDENT_AMBULATORY_CARE_PROVIDER_SITE_OTHER): Payer: Medicare Other | Admitting: Psychiatry

## 2014-12-01 DIAGNOSIS — F32A Depression, unspecified: Secondary | ICD-10-CM

## 2014-12-01 DIAGNOSIS — F329 Major depressive disorder, single episode, unspecified: Secondary | ICD-10-CM

## 2014-12-01 NOTE — Patient Instructions (Signed)
Discussed orally 
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
## 2014-12-09 ENCOUNTER — Ambulatory Visit (HOSPITAL_COMMUNITY): Payer: Self-pay | Admitting: Psychiatry

## 2014-12-09 DIAGNOSIS — M5136 Other intervertebral disc degeneration, lumbar region: Secondary | ICD-10-CM | POA: Diagnosis not present

## 2014-12-09 DIAGNOSIS — E1140 Type 2 diabetes mellitus with diabetic neuropathy, unspecified: Secondary | ICD-10-CM | POA: Diagnosis not present

## 2014-12-09 DIAGNOSIS — M545 Low back pain: Secondary | ICD-10-CM | POA: Diagnosis not present

## 2014-12-09 DIAGNOSIS — M47816 Spondylosis without myelopathy or radiculopathy, lumbar region: Secondary | ICD-10-CM | POA: Diagnosis not present

## 2014-12-15 DIAGNOSIS — E538 Deficiency of other specified B group vitamins: Secondary | ICD-10-CM | POA: Diagnosis not present

## 2014-12-18 ENCOUNTER — Ambulatory Visit (INDEPENDENT_AMBULATORY_CARE_PROVIDER_SITE_OTHER): Payer: Medicare Other | Admitting: Psychiatry

## 2014-12-18 DIAGNOSIS — F32A Depression, unspecified: Secondary | ICD-10-CM

## 2014-12-18 DIAGNOSIS — F329 Major depressive disorder, single episode, unspecified: Secondary | ICD-10-CM

## 2014-12-21 NOTE — Patient Instructions (Signed)
Discussed orally 
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
## 2014-12-29 ENCOUNTER — Ambulatory Visit (INDEPENDENT_AMBULATORY_CARE_PROVIDER_SITE_OTHER): Payer: Medicare Other | Admitting: Psychiatry

## 2014-12-29 ENCOUNTER — Encounter (HOSPITAL_COMMUNITY): Payer: Self-pay | Admitting: Psychiatry

## 2014-12-29 DIAGNOSIS — F32A Depression, unspecified: Secondary | ICD-10-CM

## 2014-12-29 DIAGNOSIS — F329 Major depressive disorder, single episode, unspecified: Secondary | ICD-10-CM | POA: Diagnosis not present

## 2014-12-29 NOTE — Patient Instructions (Signed)
Discussed orally 
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
## 2015-01-04 DIAGNOSIS — M47816 Spondylosis without myelopathy or radiculopathy, lumbar region: Secondary | ICD-10-CM | POA: Diagnosis not present

## 2015-01-04 DIAGNOSIS — Z79891 Long term (current) use of opiate analgesic: Secondary | ICD-10-CM | POA: Diagnosis not present

## 2015-01-04 DIAGNOSIS — M5136 Other intervertebral disc degeneration, lumbar region: Secondary | ICD-10-CM | POA: Diagnosis not present

## 2015-01-04 DIAGNOSIS — E1140 Type 2 diabetes mellitus with diabetic neuropathy, unspecified: Secondary | ICD-10-CM | POA: Diagnosis not present

## 2015-01-14 DIAGNOSIS — D500 Iron deficiency anemia secondary to blood loss (chronic): Secondary | ICD-10-CM | POA: Diagnosis not present

## 2015-01-14 DIAGNOSIS — E6609 Other obesity due to excess calories: Secondary | ICD-10-CM | POA: Diagnosis not present

## 2015-01-14 DIAGNOSIS — E538 Deficiency of other specified B group vitamins: Secondary | ICD-10-CM | POA: Diagnosis not present

## 2015-01-14 DIAGNOSIS — E780 Pure hypercholesterolemia: Secondary | ICD-10-CM | POA: Diagnosis not present

## 2015-01-14 DIAGNOSIS — E1165 Type 2 diabetes mellitus with hyperglycemia: Secondary | ICD-10-CM | POA: Diagnosis not present

## 2015-01-20 DIAGNOSIS — E780 Pure hypercholesterolemia: Secondary | ICD-10-CM | POA: Diagnosis not present

## 2015-01-20 DIAGNOSIS — E1165 Type 2 diabetes mellitus with hyperglycemia: Secondary | ICD-10-CM | POA: Diagnosis not present

## 2015-01-20 DIAGNOSIS — Z23 Encounter for immunization: Secondary | ICD-10-CM | POA: Diagnosis not present

## 2015-01-20 DIAGNOSIS — R5383 Other fatigue: Secondary | ICD-10-CM | POA: Diagnosis not present

## 2015-01-20 DIAGNOSIS — E538 Deficiency of other specified B group vitamins: Secondary | ICD-10-CM | POA: Diagnosis not present

## 2015-01-20 DIAGNOSIS — I10 Essential (primary) hypertension: Secondary | ICD-10-CM | POA: Diagnosis not present

## 2015-01-22 ENCOUNTER — Ambulatory Visit (INDEPENDENT_AMBULATORY_CARE_PROVIDER_SITE_OTHER): Payer: Medicare Other | Admitting: Psychiatry

## 2015-01-22 ENCOUNTER — Encounter (HOSPITAL_COMMUNITY): Payer: Self-pay | Admitting: Psychiatry

## 2015-01-22 DIAGNOSIS — F329 Major depressive disorder, single episode, unspecified: Secondary | ICD-10-CM

## 2015-01-22 DIAGNOSIS — F32A Depression, unspecified: Secondary | ICD-10-CM

## 2015-01-22 NOTE — Patient Instructions (Signed)
Discussed orally 
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
## 2015-02-05 ENCOUNTER — Ambulatory Visit (INDEPENDENT_AMBULATORY_CARE_PROVIDER_SITE_OTHER): Payer: Medicare Other | Admitting: Psychiatry

## 2015-02-05 ENCOUNTER — Encounter (HOSPITAL_COMMUNITY): Payer: Self-pay | Admitting: Psychiatry

## 2015-02-05 DIAGNOSIS — F329 Major depressive disorder, single episode, unspecified: Secondary | ICD-10-CM

## 2015-02-05 DIAGNOSIS — F32A Depression, unspecified: Secondary | ICD-10-CM
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
## 2015-02-05 NOTE — Patient Instructions (Signed)
Discussed orally 

## 2015-02-15 DIAGNOSIS — Z79891 Long term (current) use of opiate analgesic: Secondary | ICD-10-CM | POA: Diagnosis not present

## 2015-02-15 DIAGNOSIS — E1140 Type 2 diabetes mellitus with diabetic neuropathy, unspecified: Secondary | ICD-10-CM | POA: Diagnosis not present

## 2015-02-15 DIAGNOSIS — M47816 Spondylosis without myelopathy or radiculopathy, lumbar region: Secondary | ICD-10-CM | POA: Diagnosis not present

## 2015-02-15 DIAGNOSIS — M5136 Other intervertebral disc degeneration, lumbar region: Secondary | ICD-10-CM | POA: Diagnosis not present

## 2015-02-17 ENCOUNTER — Encounter (HOSPITAL_COMMUNITY): Payer: Self-pay | Admitting: Psychiatry

## 2015-02-17 ENCOUNTER — Ambulatory Visit (INDEPENDENT_AMBULATORY_CARE_PROVIDER_SITE_OTHER): Payer: Medicare Other | Admitting: Psychiatry

## 2015-02-17 DIAGNOSIS — F329 Major depressive disorder, single episode, unspecified: Secondary | ICD-10-CM | POA: Diagnosis not present

## 2015-02-17 DIAGNOSIS — F32A Depression, unspecified: Secondary | ICD-10-CM

## 2015-02-17 NOTE — Patient Instructions (Signed)
Diwscussed orally
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
## 2015-02-19 DIAGNOSIS — E539 Vitamin B deficiency, unspecified: Secondary | ICD-10-CM | POA: Diagnosis not present

## 2015-02-20 DIAGNOSIS — Z79891 Long term (current) use of opiate analgesic: Secondary | ICD-10-CM | POA: Diagnosis not present

## 2015-02-20 DIAGNOSIS — A5216 Charcot's arthropathy (tabetic): Secondary | ICD-10-CM | POA: Diagnosis not present

## 2015-02-20 DIAGNOSIS — Z79899 Other long term (current) drug therapy: Secondary | ICD-10-CM | POA: Diagnosis not present

## 2015-02-20 DIAGNOSIS — R402431 Glasgow coma scale score 3-8, in the field [EMT or ambulance]: Secondary | ICD-10-CM | POA: Diagnosis not present

## 2015-02-20 DIAGNOSIS — Z881 Allergy status to other antibiotic agents status: Secondary | ICD-10-CM | POA: Diagnosis not present

## 2015-02-20 DIAGNOSIS — I482 Chronic atrial fibrillation: Secondary | ICD-10-CM | POA: Diagnosis not present

## 2015-02-20 DIAGNOSIS — X58XXXA Exposure to other specified factors, initial encounter: Secondary | ICD-10-CM | POA: Diagnosis not present

## 2015-02-20 DIAGNOSIS — E6609 Other obesity due to excess calories: Secondary | ICD-10-CM | POA: Diagnosis not present

## 2015-02-20 DIAGNOSIS — R404 Transient alteration of awareness: Secondary | ICD-10-CM | POA: Diagnosis not present

## 2015-02-20 DIAGNOSIS — R4020 Unspecified coma: Secondary | ICD-10-CM | POA: Diagnosis not present

## 2015-02-20 DIAGNOSIS — Z9981 Dependence on supplemental oxygen: Secondary | ICD-10-CM | POA: Diagnosis not present

## 2015-02-20 DIAGNOSIS — K219 Gastro-esophageal reflux disease without esophagitis: Secondary | ICD-10-CM | POA: Diagnosis not present

## 2015-02-20 DIAGNOSIS — R401 Stupor: Secondary | ICD-10-CM | POA: Diagnosis not present

## 2015-02-20 DIAGNOSIS — J9602 Acute respiratory failure with hypercapnia: Secondary | ICD-10-CM | POA: Diagnosis not present

## 2015-02-20 DIAGNOSIS — Z7984 Long term (current) use of oral hypoglycemic drugs: Secondary | ICD-10-CM | POA: Diagnosis not present

## 2015-02-20 DIAGNOSIS — Z743 Need for continuous supervision: Secondary | ICD-10-CM | POA: Diagnosis not present

## 2015-02-20 DIAGNOSIS — E1140 Type 2 diabetes mellitus with diabetic neuropathy, unspecified: Secondary | ICD-10-CM | POA: Diagnosis not present

## 2015-02-20 DIAGNOSIS — T887XXA Unspecified adverse effect of drug or medicament, initial encounter: Secondary | ICD-10-CM | POA: Diagnosis not present

## 2015-02-20 DIAGNOSIS — I615 Nontraumatic intracerebral hemorrhage, intraventricular: Secondary | ICD-10-CM | POA: Diagnosis not present

## 2015-02-20 DIAGNOSIS — Z888 Allergy status to other drugs, medicaments and biological substances status: Secondary | ICD-10-CM | POA: Diagnosis not present

## 2015-02-20 DIAGNOSIS — Z66 Do not resuscitate: Secondary | ICD-10-CM | POA: Diagnosis not present

## 2015-02-20 DIAGNOSIS — Z91041 Radiographic dye allergy status: Secondary | ICD-10-CM | POA: Diagnosis not present

## 2015-02-20 DIAGNOSIS — Z6841 Body Mass Index (BMI) 40.0 and over, adult: Secondary | ICD-10-CM | POA: Diagnosis not present

## 2015-02-20 DIAGNOSIS — I629 Nontraumatic intracranial hemorrhage, unspecified: Secondary | ICD-10-CM | POA: Diagnosis not present

## 2015-02-20 DIAGNOSIS — I4891 Unspecified atrial fibrillation: Secondary | ICD-10-CM | POA: Diagnosis not present

## 2015-02-20 DIAGNOSIS — Z7901 Long term (current) use of anticoagulants: Secondary | ICD-10-CM | POA: Diagnosis not present

## 2015-03-01 ENCOUNTER — Telehealth (HOSPITAL_COMMUNITY): Payer: Self-pay | Admitting: *Deleted

## 2015-03-01 NOTE — Telephone Encounter (Signed)
Patient's husband came by today, said to cancel all appointments.   Patient died on Mon. Oct. 17th.    He said to thank Darra LisPeggy, Kennesha Brewbaker and Lajoyce Cornersctavia for everything done in providing treatment for patient.

## 2015-03-03 ENCOUNTER — Ambulatory Visit (HOSPITAL_COMMUNITY): Payer: Self-pay | Admitting: Psychiatry

## 2015-03-09 DEATH — deceased

## 2015-03-17 ENCOUNTER — Ambulatory Visit (HOSPITAL_COMMUNITY): Payer: Self-pay | Admitting: Psychiatry
